# Patient Record
Sex: Male | Born: 1990
Health system: Southern US, Community
[De-identification: ages and names within clinical notes are randomized; demographics above are authoritative.]

## PROBLEM LIST (undated history)

## (undated) DIAGNOSIS — I10 Essential (primary) hypertension: Secondary | ICD-10-CM

## (undated) HISTORY — PX: ANKLE SURGERY: SHX546

---

## 2012-12-31 ENCOUNTER — Encounter (HOSPITAL_COMMUNITY): Payer: Self-pay | Admitting: *Deleted

## 2012-12-31 ENCOUNTER — Emergency Department (HOSPITAL_COMMUNITY)
Admission: EM | Admit: 2012-12-31 | Discharge: 2013-01-01 | Disposition: A | Discharge status: Home / Self Care | Payer: Self-pay | Attending: Emergency Medicine | Admitting: Emergency Medicine

## 2012-12-31 ENCOUNTER — Emergency Department (HOSPITAL_COMMUNITY): Payer: Self-pay

## 2012-12-31 DIAGNOSIS — S8990XA Unspecified injury of unspecified lower leg, initial encounter: Secondary | ICD-10-CM | POA: Insufficient documentation

## 2012-12-31 DIAGNOSIS — F17200 Nicotine dependence, unspecified, uncomplicated: Secondary | ICD-10-CM | POA: Insufficient documentation

## 2012-12-31 DIAGNOSIS — M25561 Pain in right knee: Secondary | ICD-10-CM

## 2012-12-31 DIAGNOSIS — Y929 Unspecified place or not applicable: Secondary | ICD-10-CM | POA: Insufficient documentation

## 2012-12-31 DIAGNOSIS — X500XXA Overexertion from strenuous movement or load, initial encounter: Secondary | ICD-10-CM | POA: Insufficient documentation

## 2012-12-31 DIAGNOSIS — Y9389 Activity, other specified: Secondary | ICD-10-CM | POA: Insufficient documentation

## 2012-12-31 HISTORY — DX: Essential (primary) hypertension: I10
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
## 2012-12-31 NOTE — ED Provider Notes (Signed)
History    This chart was scribed for non-physician practitioner Magnus Sinning, PA working with Benny Lennert, MD by Quintella Reichert, ED Scribe. This patient was seen in room WTR5/WTR5 and the patient's care was started at 11:10 PM.   CSN: [id number]  Arrival date & time 12/31/12  2251     Chief Complaint: Knee Injury   The history is provided by the patient. No language interpreter was used.     HPI Comments: Harold Bender is a 22 y.o. male who presents to the Emergency Department complaining of a right knee injury that he sustained 30 minutes ago.  Pt states that "I picked up my fat friend" and fell and felt his right knee pop.  He immediately developed constant, moderate-to-severe pain to the knee that is greatly exacerbated by bearing weight   He denies weakness, numbness or tingling to the area.  He states that he has noticed mild swelling of the knee.  He admits to h/o meniscus tear in the left knee as a child and he states that his present pain is more severe.  He has not taken anything for pain prior to arrival.      No past medical history on file.   No past surgical history on file.   No family history on file.   History  Substance Use Topics  . Smoking status: Not on file  . Smokeless tobacco: Not on file  . Alcohol Use: Not on file     Review of Systems  All other systems reviewed and are negative.      Allergies  Review of patient's allergies indicates not on file.  Home Medications  No current outpatient prescriptions on file.  BP 169/81  Pulse 128  Temp(Src) 98 F (36.7 C) (Oral)  Resp 18  SpO2 99%  Physical Exam  Nursing note and vitals reviewed. Constitutional: He appears well-developed and well-nourished. No distress.  HENT:  Head: Normocephalic and atraumatic.  Eyes: Conjunctivae are normal.  Neck: Normal range of motion. Neck supple.  Cardiovascular: Normal rate, regular rhythm and normal heart sounds.   No murmur  heard. Pulmonary/Chest: Effort normal and breath sounds normal. No respiratory distress. He has no wheezes. He has no rales.  Musculoskeletal:       Right knee: He exhibits normal range of motion, no swelling, no erythema, normal alignment, no LCL laxity, normal patellar mobility, no bony tenderness and no MCL laxity. No tenderness found.  No bony tenderness to right knee Negative anterior and posterior drawer of the right knee Slight pain with valgus stress of the knee  Neurological: He is alert.  Skin: Skin is warm and dry.  Psychiatric: He has a normal mood and affect. His behavior is normal.    ED Course  Procedures (including critical care time)  DIAGNOSTIC STUDIES: Oxygen Saturation is 99% on room air, normal by my interpretation.    COORDINATION OF CARE: 11:16 PM: Discussed treatment plan which includes imaging.  Pt expressed understanding and agreed to plan.    Labs Reviewed - No data to display  Dg Knee Complete 4 Views Right  01/01/2013   *RADIOLOGY REPORT*  Clinical Data: Medial right knee pain following an injury.  RIGHT KNEE - COMPLETE 4+ VIEW  Comparison: None.  Findings: Normal appearing bones and soft tissues without fracture or dislocation.  Normal appearing bones and soft tissues without fracture, dislocation or effusion.  IMPRESSION: Normal examination.   Original Report Authenticated By: Beckie Salts, M.D.  No diagnosis found.  MDM  Patient presenting with pain of the right knee after picking up his friend just prior to arrival.  He states that he felt a "popping" in his knee.  No obvious laxity of the ligaments of the knee at this time.  Xray negative.  Patient neurovascularly intact.  Patient given knee immobilizer and crutches and also referral to Orthopedics.   I personally performed the services described in this documentation, which was scribed in my presence. The recorded information has been reviewed and is accurate.    Pascal Lux [place],  PA-C 01/01/13 1515

## 2013-01-01 MED ORDER — NAPROXEN 500 MG PO TABS: 500.0000 mg | ORAL_TABLET | Freq: Two times a day (BID) | ORAL | Status: DC

## 2013-01-01 MED ORDER — HYDROCODONE-ACETAMINOPHEN 5-325 MG PO TABS: 1.0000 | ORAL_TABLET | Freq: Four times a day (QID) | ORAL | Status: DC | PRN

## 2013-01-02 NOTE — ED Provider Notes (Signed)
Medical screening examination/treatment/procedure(s) were performed by non-physician practitioner and as supervising physician I was immediately available for consultation/collaboration.   Benny Lennert, MD 01/02/13 1539

## 2015-11-04 ENCOUNTER — Emergency Department (HOSPITAL_COMMUNITY)
Admission: EM | Admit: 2015-11-04 | Discharge: 2015-11-04 | Disposition: A | Discharge status: Home / Self Care | Payer: No Typology Code available for payment source | Attending: Emergency Medicine | Admitting: Emergency Medicine

## 2015-11-04 ENCOUNTER — Encounter (HOSPITAL_COMMUNITY): Payer: Self-pay | Admitting: Emergency Medicine

## 2015-11-04 DIAGNOSIS — M549 Dorsalgia, unspecified: Secondary | ICD-10-CM | POA: Diagnosis present

## 2015-11-04 DIAGNOSIS — Y92410 Unspecified street and highway as the place of occurrence of the external cause: Secondary | ICD-10-CM | POA: Diagnosis not present

## 2015-11-04 DIAGNOSIS — Z791 Long term (current) use of non-steroidal anti-inflammatories (NSAID): Secondary | ICD-10-CM | POA: Insufficient documentation

## 2015-11-04 DIAGNOSIS — I10 Essential (primary) hypertension: Secondary | ICD-10-CM | POA: Insufficient documentation

## 2015-11-04 DIAGNOSIS — Z79891 Long term (current) use of opiate analgesic: Secondary | ICD-10-CM | POA: Diagnosis not present

## 2015-11-04 DIAGNOSIS — Y999 Unspecified external cause status: Secondary | ICD-10-CM | POA: Insufficient documentation

## 2015-11-04 DIAGNOSIS — M62830 Muscle spasm of back: Secondary | ICD-10-CM | POA: Diagnosis not present

## 2015-11-04 DIAGNOSIS — Z79899 Other long term (current) drug therapy: Secondary | ICD-10-CM | POA: Insufficient documentation

## 2015-11-04 DIAGNOSIS — Y939 Activity, unspecified: Secondary | ICD-10-CM | POA: Diagnosis not present

## 2015-11-04 DIAGNOSIS — F17210 Nicotine dependence, cigarettes, uncomplicated: Secondary | ICD-10-CM | POA: Diagnosis not present

## 2015-11-04 MED ORDER — CYCLOBENZAPRINE HCL 5 MG PO TABS: 5.0000 mg | ORAL_TABLET | Freq: Three times a day (TID) | ORAL | Status: DC | PRN

## 2015-11-04 MED ORDER — CYCLOBENZAPRINE HCL 10 MG PO TABS: 5.0000 mg | ORAL_TABLET | Freq: Once | ORAL | Status: DC

## 2015-11-04 MED ORDER — IBUPROFEN 800 MG PO TABS: 800.0000 mg | ORAL_TABLET | Freq: Three times a day (TID) | ORAL | Status: DC

## 2015-11-04 MED ORDER — IBUPROFEN 800 MG PO TABS
800.0000 mg | ORAL_TABLET | Freq: Once | ORAL | Status: AC
  Administered 2015-11-04: 800 mg via ORAL
  Filled 2015-11-04: qty 1
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
## 2015-11-04 NOTE — Discharge Instructions (Signed)
Take motrin for pain.   Take flexeril for muscle spasms.   Follow up with your doctor.   Recheck blood pressure with your doctor in a week.   Return to ER if you have severe back pain, unable to walk, numbness, weakness, abdominal pain, chest pain
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
## 2016-01-28 ENCOUNTER — Encounter (HOSPITAL_COMMUNITY): Payer: Self-pay | Admitting: *Deleted

## 2016-01-28 ENCOUNTER — Ambulatory Visit (INDEPENDENT_AMBULATORY_CARE_PROVIDER_SITE_OTHER): Payer: BLUE CROSS/BLUE SHIELD

## 2016-01-28 ENCOUNTER — Ambulatory Visit (HOSPITAL_COMMUNITY)
Admission: EM | Admit: 2016-01-28 | Discharge: 2016-01-28 | Disposition: A | Discharge status: Home / Self Care | Payer: BLUE CROSS/BLUE SHIELD | Attending: Family Medicine | Admitting: Family Medicine

## 2016-01-28 DIAGNOSIS — I10 Essential (primary) hypertension: Secondary | ICD-10-CM

## 2016-01-28 DIAGNOSIS — R0789 Other chest pain: Secondary | ICD-10-CM

## 2016-01-28 MED ORDER — DICLOFENAC POTASSIUM 50 MG PO TABS: 50.0000 mg | ORAL_TABLET | Freq: Three times a day (TID) | ORAL | 1 refills | Status: DC

## 2016-01-28 MED ORDER — KETOROLAC TROMETHAMINE 30 MG/ML IJ SOLN
30.0000 mg | Freq: Once | INTRAMUSCULAR | Status: AC
  Administered 2016-01-28: 30 mg via INTRAMUSCULAR

## 2016-01-28 MED ORDER — KETOROLAC TROMETHAMINE 30 MG/ML IJ SOLN
INTRAMUSCULAR | Status: AC
  Filled 2016-01-28: qty 1

## 2016-01-28 NOTE — ED Provider Notes (Signed)
[facility]    CSN: [id number] Arrival date & time: 01/28/16  1650  First Provider Contact:  First MD Initiated Contact with Patient 01/28/16 1753        History   Chief Complaint Chief Complaint  Patient presents with  . Chest Pain    HPI Harold Bender is a 25 y.o. male.   The history is provided by the patient.  Chest Pain  Pain quality: sharp   Pain radiates to:  Does not radiate Pain severity:  Mild Duration:  1 week Progression:  Unchanged Chronicity:  New Context: movement   Relieved by:  None tried Worsened by:  Nothing Ineffective treatments:  None tried Associated symptoms: no abdominal pain, no back pain, no fever, no palpitations and no shortness of breath   Risk factors: male sex     Past Medical History:  Diagnosis Date  . Hypertension     There are no active problems to display for this patient.   History reviewed. No pertinent surgical history.     Home Medications    Prior to Admission medications   Medication Sig Start Date End Date Taking? Authorizing Provider  cetirizine (ZYRTEC) 5 MG tablet Take 5 mg by mouth daily.    Historical Provider, MD  cyclobenzaprine (FLEXERIL) 5 MG tablet Take 1 tablet (5 mg total) by mouth 3 (three) times daily as needed for muscle spasms. 11/04/15   Charlynne Pander, MD  fexofenadine-pseudoephedrine (ALLEGRA-D 24) 180-240 MG per 24 hr tablet Take 1 tablet by mouth daily.    Historical Provider, MD  HYDROcodone-acetaminophen (NORCO/VICODIN) 5-325 MG per tablet Take 1-2 tablets by mouth every 6 (six) hours as needed for pain. 01/01/13   Heather Laisure, PA-C  ibuprofen (ADVIL,MOTRIN) 800 MG tablet Take 1 tablet (800 mg total) by mouth 3 (three) times daily. 11/04/15   Charlynne Pander, MD  naproxen (NAPROSYN) 500 MG tablet Take 1 tablet (500 mg total) by mouth 2 (two) times daily. 01/01/13   Santiago Glad, PA-C    Family History History reviewed. No pertinent family history.  Social  History Social History  Substance Use Topics  . Smoking status: Current Every Day Smoker    Types: Cigarettes  . Smokeless tobacco: Not on file  . Alcohol use Yes     Allergies   Apple   Review of Systems Review of Systems  Constitutional: Negative.  Negative for fever.  HENT: Negative.   Respiratory: Negative for shortness of breath.   Cardiovascular: Positive for chest pain. Negative for palpitations.  Gastrointestinal: Negative for abdominal pain.  Musculoskeletal: Negative for back pain.  All other systems reviewed and are negative.    Physical Exam Triage Vital Signs ED Triage Vitals  Enc Vitals Group     BP 01/28/16 1725 (!) 169/114     Pulse Rate 01/28/16 1725 78     Resp 01/28/16 1725 12     Temp 01/28/16 1725 99.6 F (37.6 C)     Temp Source 01/28/16 1725 Oral     SpO2 01/28/16 1725 100 %     Weight --      Height --      Head Circumference --      Peak Flow --      Pain Score 01/28/16 1734 6     Pain Loc --      Pain Edu? --      Excl. in GC? --    No data found.   Updated Vital Signs BP Marland Kitchen)  190/140 (BP Location: Right Arm)   Pulse 78   Temp 99.6 F (37.6 C) (Oral)   Resp 12   SpO2 100%   Visual Acuity Right Eye Distance:   Left Eye Distance:   Bilateral Distance:    Right Eye Near:   Left Eye Near:    Bilateral Near:     Physical Exam  Constitutional: He is oriented to person, place, and time. He appears well-developed and well-nourished. No distress.  HENT:  Head: Normocephalic.  Right Ear: External ear normal.  Left Ear: External ear normal.  Mouth/Throat: Oropharynx is clear and moist.  Eyes: Pupils are equal, round, and reactive to light.  Neck: Normal range of motion. Neck supple.  Cardiovascular: Normal rate, regular rhythm, normal heart sounds and intact distal pulses.   Pulmonary/Chest: Effort normal and breath sounds normal. He exhibits tenderness.  Abdominal: Soft. Bowel sounds are normal.  Musculoskeletal: Normal  range of motion.  Lymphadenopathy:    He has no cervical adenopathy.  Neurological: He is alert and oriented to person, place, and time.  Skin: Skin is warm and dry.  Nursing note and vitals reviewed.    UC Treatments / Results  Labs (all labs ordered are listed, but only abnormal results are displayed) Labs Reviewed - No data to display  EKG ED ECG REPORT   Date: 01/28/2016  Rate: 75  Rhythm: normal sinus rhythm  QRS Axis: normal  Intervals: normal  ST/T Wave abnormalities: normal  Conduction Disutrbances:none  Narrative Interpretation: wnl.  Old EKG Reviewed: none available  I have personally reviewed the EKG tracing and agree with the computerized printout as noted.     Radiology No results found. X-rays reviewed and report per radiologist. Procedures Procedures (including critical care time) X-rays reviewed and report per radiologist.  Medications Ordered in UC Medications - No data to display   Initial Impression / Assessment and Plan / UC Course  I have reviewed the triage vital signs and the nursing notes.  Pertinent labs & imaging results that were available during my care of the patient were reviewed by me and considered in my medical decision making (see chart for details).  Clinical Course      Final Clinical Impressions(s) / UC Diagnoses   Final diagnoses:  None    New Prescriptions New Prescriptions   No medications on file     Linna Hoff, MD 01/28/16 1858

## 2016-01-28 NOTE — ED Triage Notes (Signed)
Pt  Reports  Chest  Discomfort  For    About  1   Week      He  Has  High  Blood  Pressure  In past  But  Has  Not  Been on  Any  meds  For  Same

## 2019-03-11 ENCOUNTER — Other Ambulatory Visit: Payer: Self-pay

## 2019-03-11 DIAGNOSIS — Z20822 Contact with and (suspected) exposure to covid-19: Secondary | ICD-10-CM

## 2019-03-12 LAB — NOVEL CORONAVIRUS, NAA: SARS-CoV-2, NAA: NOT DETECTED

## 2021-05-06 ENCOUNTER — Emergency Department (HOSPITAL_COMMUNITY): Payer: BC Managed Care – PPO

## 2021-05-06 ENCOUNTER — Inpatient Hospital Stay (HOSPITAL_COMMUNITY)
Admission: EM | Admit: 2021-05-06 | Discharge: 2021-05-10 | DRG: 193 | Disposition: A | Discharge status: Home / Self Care | Payer: BC Managed Care – PPO | Source: Ambulatory Visit | Attending: Internal Medicine | Admitting: Internal Medicine

## 2021-05-06 ENCOUNTER — Encounter (HOSPITAL_COMMUNITY): Payer: Self-pay | Admitting: Internal Medicine

## 2021-05-06 DIAGNOSIS — I517 Cardiomegaly: Secondary | ICD-10-CM | POA: Diagnosis not present

## 2021-05-06 DIAGNOSIS — I110 Hypertensive heart disease with heart failure: Secondary | ICD-10-CM | POA: Diagnosis not present

## 2021-05-06 DIAGNOSIS — R17 Unspecified jaundice: Secondary | ICD-10-CM | POA: Diagnosis present

## 2021-05-06 DIAGNOSIS — F1210 Cannabis abuse, uncomplicated: Secondary | ICD-10-CM | POA: Diagnosis present

## 2021-05-06 DIAGNOSIS — Z79899 Other long term (current) drug therapy: Secondary | ICD-10-CM | POA: Diagnosis not present

## 2021-05-06 DIAGNOSIS — J209 Acute bronchitis, unspecified: Secondary | ICD-10-CM | POA: Diagnosis not present

## 2021-05-06 DIAGNOSIS — R059 Cough, unspecified: Secondary | ICD-10-CM | POA: Diagnosis not present

## 2021-05-06 DIAGNOSIS — F1910 Other psychoactive substance abuse, uncomplicated: Secondary | ICD-10-CM | POA: Diagnosis present

## 2021-05-06 DIAGNOSIS — Z20822 Contact with and (suspected) exposure to covid-19: Secondary | ICD-10-CM | POA: Diagnosis not present

## 2021-05-06 DIAGNOSIS — T465X6A Underdosing of other antihypertensive drugs, initial encounter: Secondary | ICD-10-CM | POA: Diagnosis present

## 2021-05-06 DIAGNOSIS — F17210 Nicotine dependence, cigarettes, uncomplicated: Secondary | ICD-10-CM | POA: Diagnosis present

## 2021-05-06 DIAGNOSIS — Z6836 Body mass index (BMI) 36.0-36.9, adult: Secondary | ICD-10-CM

## 2021-05-06 DIAGNOSIS — G4733 Obstructive sleep apnea (adult) (pediatric): Secondary | ICD-10-CM | POA: Diagnosis not present

## 2021-05-06 DIAGNOSIS — Z91120 Patient's intentional underdosing of medication regimen due to financial hardship: Secondary | ICD-10-CM | POA: Diagnosis not present

## 2021-05-06 DIAGNOSIS — D696 Thrombocytopenia, unspecified: Secondary | ICD-10-CM | POA: Diagnosis not present

## 2021-05-06 DIAGNOSIS — I43 Cardiomyopathy in diseases classified elsewhere: Secondary | ICD-10-CM | POA: Diagnosis present

## 2021-05-06 DIAGNOSIS — Z8249 Family history of ischemic heart disease and other diseases of the circulatory system: Secondary | ICD-10-CM

## 2021-05-06 DIAGNOSIS — F1410 Cocaine abuse, uncomplicated: Secondary | ICD-10-CM | POA: Diagnosis present

## 2021-05-06 DIAGNOSIS — R0789 Other chest pain: Secondary | ICD-10-CM | POA: Diagnosis not present

## 2021-05-06 DIAGNOSIS — R7989 Other specified abnormal findings of blood chemistry: Secondary | ICD-10-CM | POA: Diagnosis not present

## 2021-05-06 DIAGNOSIS — I420 Dilated cardiomyopathy: Secondary | ICD-10-CM | POA: Diagnosis not present

## 2021-05-06 DIAGNOSIS — J189 Pneumonia, unspecified organism: Secondary | ICD-10-CM | POA: Diagnosis not present

## 2021-05-06 DIAGNOSIS — E876 Hypokalemia: Secondary | ICD-10-CM | POA: Diagnosis not present

## 2021-05-06 DIAGNOSIS — I161 Hypertensive emergency: Secondary | ICD-10-CM | POA: Diagnosis present

## 2021-05-06 DIAGNOSIS — R000 Tachycardia, unspecified: Secondary | ICD-10-CM | POA: Diagnosis not present

## 2021-05-06 DIAGNOSIS — R9431 Abnormal electrocardiogram [ECG] [EKG]: Secondary | ICD-10-CM | POA: Diagnosis not present

## 2021-05-06 DIAGNOSIS — I5021 Acute systolic (congestive) heart failure: Secondary | ICD-10-CM | POA: Diagnosis not present

## 2021-05-06 DIAGNOSIS — E669 Obesity, unspecified: Secondary | ICD-10-CM | POA: Diagnosis not present

## 2021-05-06 DIAGNOSIS — I5041 Acute combined systolic (congestive) and diastolic (congestive) heart failure: Secondary | ICD-10-CM | POA: Diagnosis present

## 2021-05-06 DIAGNOSIS — Z6839 Body mass index (BMI) 39.0-39.9, adult: Secondary | ICD-10-CM | POA: Diagnosis not present

## 2021-05-06 DIAGNOSIS — R0602 Shortness of breath: Secondary | ICD-10-CM | POA: Diagnosis not present

## 2021-05-06 DIAGNOSIS — I10 Essential (primary) hypertension: Secondary | ICD-10-CM | POA: Diagnosis present

## 2021-05-06 DIAGNOSIS — R079 Chest pain, unspecified: Secondary | ICD-10-CM | POA: Diagnosis not present

## 2021-05-06 LAB — CBC WITH DIFFERENTIAL/PLATELET
Abs Immature Granulocytes: 0.01 10*3/uL (ref 0.00–0.07)
Basophils Absolute: 0 10*3/uL (ref 0.0–0.1)
Basophils Relative: 1 %
Eosinophils Absolute: 0 10*3/uL (ref 0.0–0.5)
Eosinophils Relative: 1 %
HCT: 41.9 % (ref 39.0–52.0)
Hemoglobin: 14.4 g/dL (ref 13.0–17.0)
Immature Granulocytes: 0 %
Lymphocytes Relative: 11 %
Lymphs Abs: 0.7 10*3/uL (ref 0.7–4.0)
MCH: 29.5 pg (ref 26.0–34.0)
MCHC: 34.4 g/dL (ref 30.0–36.0)
MCV: 85.9 fL (ref 80.0–100.0)
Monocytes Absolute: 0.3 10*3/uL (ref 0.1–1.0)
Monocytes Relative: 4 %
Neutro Abs: 5.3 10*3/uL (ref 1.7–7.7)
Neutrophils Relative %: 83 %
Platelets: 156 10*3/uL (ref 150–400)
RBC: 4.88 MIL/uL (ref 4.22–5.81)
RDW: 14.3 % (ref 11.5–15.5)
WBC: 6.4 10*3/uL (ref 4.0–10.5)
nRBC: 0 % (ref 0.0–0.2)

## 2021-05-06 LAB — BASIC METABOLIC PANEL
Anion gap: 9 (ref 5–15)
BUN: 12 mg/dL (ref 6–20)
CO2: 21 mmol/L — ABNORMAL LOW (ref 22–32)
Calcium: 8.7 mg/dL — ABNORMAL LOW (ref 8.9–10.3)
Chloride: 107 mmol/L (ref 98–111)
Creatinine, Ser: 1.19 mg/dL (ref 0.61–1.24)
GFR, Estimated: >60 mL/min (ref >60)
Glucose, Bld: 127 mg/dL — ABNORMAL HIGH (ref 70–99)
Potassium: 3.4 mmol/L — ABNORMAL LOW (ref 3.5–5.1)
Sodium: 137 mmol/L (ref 135–145)

## 2021-05-06 LAB — RESP PANEL BY RT-PCR (FLU A&B, COVID) ARPGX2
Influenza A by PCR: NEGATIVE
Influenza B by PCR: NEGATIVE
SARS Coronavirus 2 by RT PCR: NEGATIVE

## 2021-05-06 LAB — BRAIN NATRIURETIC PEPTIDE: B Natriuretic Peptide: 1019 pg/mL — ABNORMAL HIGH (ref 0.0–100.0)

## 2021-05-06 LAB — PROCALCITONIN: Procalcitonin: 0.14 ng/mL

## 2021-05-06 LAB — LACTIC ACID, PLASMA: Lactic Acid, Venous: 1.2 mmol/L (ref 0.5–1.9)

## 2021-05-06 LAB — MRSA NEXT GEN BY PCR, NASAL: MRSA by PCR Next Gen: NOT DETECTED

## 2021-05-06 MED ORDER — ONDANSETRON HCL 4 MG PO TABS: 4.0000 mg | ORAL_TABLET | Freq: Four times a day (QID) | ORAL | Status: DC | PRN

## 2021-05-06 MED ORDER — SODIUM CHLORIDE 0.9 % IV SOLN
500.0000 mg | INTRAVENOUS | Status: DC
  Administered 2021-05-06: 500 mg via INTRAVENOUS
  Filled 2021-05-06: qty 500

## 2021-05-06 MED ORDER — SODIUM CHLORIDE 0.9% FLUSH
3.0000 mL | Freq: Two times a day (BID) | INTRAVENOUS | Status: DC
  Administered 2021-05-06 – 2021-05-10 (×7): 3 mL via INTRAVENOUS

## 2021-05-06 MED ORDER — ACETAMINOPHEN 325 MG PO TABS
650.0000 mg | ORAL_TABLET | Freq: Four times a day (QID) | ORAL | Status: DC | PRN
  Administered 2021-05-07 – 2021-05-09 (×3): 650 mg via ORAL
  Filled 2021-05-06 (×4): qty 2

## 2021-05-06 MED ORDER — OXYCODONE HCL 5 MG PO TABS
5.0000 mg | ORAL_TABLET | ORAL | Status: DC | PRN
  Administered 2021-05-06 – 2021-05-08 (×3): 5 mg via ORAL
  Filled 2021-05-06 (×3): qty 1

## 2021-05-06 MED ORDER — POLYETHYLENE GLYCOL 3350 17 G PO PACK: 17.0000 g | PACK | Freq: Every day | ORAL | Status: DC | PRN

## 2021-05-06 MED ORDER — DILTIAZEM HCL 25 MG/5ML IV SOLN
20.0000 mg | Freq: Once | INTRAVENOUS | Status: AC
  Administered 2021-05-06: 20 mg via INTRAVENOUS
  Filled 2021-05-06: qty 5

## 2021-05-06 MED ORDER — ACETAMINOPHEN 650 MG RE SUPP: 650.0000 mg | Freq: Four times a day (QID) | RECTAL | Status: DC | PRN

## 2021-05-06 MED ORDER — HYDRALAZINE HCL 20 MG/ML IJ SOLN
10.0000 mg | INTRAMUSCULAR | Status: DC | PRN
  Administered 2021-05-06 – 2021-05-08 (×3): 10 mg via INTRAVENOUS
  Filled 2021-05-06 (×4): qty 1

## 2021-05-06 MED ORDER — CHLORHEXIDINE GLUCONATE CLOTH 2 % EX PADS
6.0000 | MEDICATED_PAD | Freq: Every day | CUTANEOUS | Status: DC
  Administered 2021-05-06 – 2021-05-10 (×4): 6 via TOPICAL

## 2021-05-06 MED ORDER — BISACODYL 5 MG PO TBEC: 5.0000 mg | DELAYED_RELEASE_TABLET | Freq: Every day | ORAL | Status: DC | PRN

## 2021-05-06 MED ORDER — HYDRALAZINE HCL 20 MG/ML IJ SOLN
5.0000 mg | INTRAMUSCULAR | Status: DC | PRN
  Administered 2021-05-06: 5 mg via INTRAVENOUS
  Filled 2021-05-06: qty 1

## 2021-05-06 MED ORDER — ENOXAPARIN SODIUM 40 MG/0.4ML IJ SOSY
40.0000 mg | PREFILLED_SYRINGE | INTRAMUSCULAR | Status: DC
  Administered 2021-05-06: 40 mg via SUBCUTANEOUS
  Filled 2021-05-06: qty 0.4

## 2021-05-06 MED ORDER — NICOTINE 7 MG/24HR TD PT24
7.0000 mg | MEDICATED_PATCH | Freq: Every day | TRANSDERMAL | Status: DC
Start: 2021-05-06 — End: 2021-05-09
  Filled 2021-05-06 (×4): qty 1

## 2021-05-06 MED ORDER — DOCUSATE SODIUM 100 MG PO CAPS
100.0000 mg | ORAL_CAPSULE | Freq: Two times a day (BID) | ORAL | Status: DC
  Administered 2021-05-06 – 2021-05-08 (×3): 100 mg via ORAL
  Filled 2021-05-06 (×8): qty 1

## 2021-05-06 MED ORDER — LACTATED RINGERS IV SOLN: INTRAVENOUS | Status: DC

## 2021-05-06 MED ORDER — CLONIDINE HCL 0.1 MG PO TABS
0.1000 mg | ORAL_TABLET | Freq: Once | ORAL | Status: AC
  Administered 2021-05-06: 0.1 mg via ORAL
  Filled 2021-05-06: qty 1

## 2021-05-06 MED ORDER — NICARDIPINE HCL IN NACL 20-0.86 MG/200ML-% IV SOLN
3.0000 mg/h | INTRAVENOUS | Status: DC
  Filled 2021-05-06: qty 200

## 2021-05-06 MED ORDER — ONDANSETRON HCL 4 MG/2ML IJ SOLN: 4.0000 mg | Freq: Four times a day (QID) | INTRAMUSCULAR | Status: DC | PRN

## 2021-05-06 MED ORDER — ALBUTEROL SULFATE HFA 108 (90 BASE) MCG/ACT IN AERS
2.0000 | INHALATION_SPRAY | RESPIRATORY_TRACT | Status: DC | PRN
  Administered 2021-05-06: 2 via RESPIRATORY_TRACT
  Filled 2021-05-06: qty 6.7

## 2021-05-06 MED ORDER — SODIUM CHLORIDE 0.9 % IV SOLN
INTRAVENOUS | Status: DC
Start: 2021-05-06 — End: 2021-05-07

## 2021-05-06 MED ORDER — MORPHINE SULFATE (PF) 2 MG/ML IV SOLN: 2.0000 mg | INTRAVENOUS | Status: DC | PRN

## 2021-05-06 MED ORDER — SODIUM CHLORIDE 0.9 % IV SOLN
1.0000 g | INTRAVENOUS | Status: DC
  Administered 2021-05-06 – 2021-05-09 (×4): 1 g via INTRAVENOUS
  Filled 2021-05-06 (×5): qty 10

## 2021-05-06 MED ORDER — LOSARTAN POTASSIUM 50 MG PO TABS: 25.0000 mg | ORAL_TABLET | Freq: Every day | ORAL | Status: DC

## 2021-05-06 MED ORDER — GUAIFENESIN ER 600 MG PO TB12: 600.0000 mg | ORAL_TABLET | Freq: Two times a day (BID) | ORAL | Status: DC | PRN

## 2021-05-06 NOTE — Assessment & Plan Note (Signed)
-  Likely associated with infection, anticipate resolution once treated -Will attempt to avoid QT-prolonging medications such as PPI, nausea meds, SSRIs -Repeat EKG in AM

## 2021-05-06 NOTE — H&P (Signed)
History and Physical    Harold Bender ZOX:[id number] DOB: 11/15/1990 DOA: 05/06/2021  PCP: Patient, No Pcp Per (Inactive) - last years ago until this AM (saw Harold Bender at [facility] this AM) Consultants:  None Patient coming from:  Home - lives with wife and a family friend; NOK: Wife, [phone]  Chief Complaint: Cough  HPI: Harold Bender is a 30 y.o. male with medical history significant of HTN (off meds for 2 years) presenting with cough. He reports cough that has been recurring.  The last 2 days it was unbearable with chest and abdominal pain.  Also has not taken BP meds in 2-3 years, unable to afford.  He had a mild fever Monday briefly.  Wife started coughing last night.  Chest pain was with coughing and would radiate into his ribs, otherwise not present.  No headache.   ED Course: Cough, SOB.  Has PNA on CXR.  Profoundly HTN, uses cocaine.  Started on IV abx.  Given one time dose of Cardizem, given Clonidine (ordered, not given yet) - if no response, may need Cardene drip.  Review of Systems: As per HPI; otherwise review of systems reviewed and negative.   Ambulatory Status:  Ambulates without assistance  COVID Vaccine Status:   Complete  Past Medical History:  Diagnosis Date   Hypertension     Past Surgical History:  Procedure Laterality Date   ANKLE SURGERY Left     Social History   Socioeconomic History   Marital status: Single    Spouse name: Not on file   Number of children: Not on file   Years of education: Not on file   Highest education level: Not on file  Occupational History   Occupation: insurance  Tobacco Use   Smoking status: Some Days    Types: Cigarettes   Smokeless tobacco: Never   Tobacco comments:    With alcohol  Substance and Sexual Activity   Alcohol use: Yes    Comment: binge drinking - 7-10 drinks on weekned nights   Drug use: Yes    Types: Marijuana, Cocaine    Comment: daily marijuana use; last used cocaine 2 weeks ago   Sexual  activity: Yes    Birth control/protection: None  Other Topics Concern   Not on file  Social History Narrative   Not on file   Social Determinants of Health   Financial Resource Strain: Not on file  Food Insecurity: Not on file  Transportation Needs: Not on file  Physical Activity: Not on file  Stress: Not on file  Social Connections: Not on file  Intimate Partner Violence: Not on file    Allergies  Allergen Reactions   Apple Nausea And Vomiting    History reviewed. No pertinent family history.  Prior to Admission medications   Medication Sig Start Date End Date Taking? Authorizing Provider  cetirizine (ZYRTEC) 5 MG tablet Take 5 mg by mouth daily.    [provider]  cyclobenzaprine (FLEXERIL) 5 MG tablet Take 1 tablet (5 mg total) by mouth 3 (three) times daily as needed for muscle spasms. 11/04/15   Charlynne Pander, MD  diclofenac (CATAFLAM) 50 MG tablet Take 1 tablet (50 mg total) by mouth 3 (three) times daily. Prn chest pain 01/28/16   Linna Hoff, MD  fexofenadine-pseudoephedrine (ALLEGRA-D 24) 180-240 MG per 24 hr tablet Take 1 tablet by mouth daily.    [provider]  HYDROcodone-acetaminophen (NORCO/VICODIN) 5-325 MG per tablet Take 1-2 tablets by mouth every 6 (  six) hours as needed for pain. 01/01/13   Santiago Glad, PA-C  ibuprofen (ADVIL,MOTRIN) 800 MG tablet Take 1 tablet (800 mg total) by mouth 3 (three) times daily. 11/04/15   Charlynne Pander, MD  naproxen (NAPROSYN) 500 MG tablet Take 1 tablet (500 mg total) by mouth 2 (two) times daily. 01/01/13   Santiago Glad, PA-C    Physical Exam: Vitals:   05/06/21 1415 05/06/21 1430 05/06/21 1558 05/06/21 1600  BP: (!) 221/147 (!) 214/148 (!) 197/133 (!) 197/133  Pulse: (!) 120 (!) 118 (!) 119 (!) 117  Resp: 18 (!) 22 (!) 22 (!) 21  Temp:   98.6 F (37 C)   TempSrc:   Oral   SpO2: 96% 95% 95% 96%     General:  Appears calm and comfortable and is in NAD Eyes:  PERRL, EOMI, normal  lids, iris ENT:  grossly normal hearing, lips & tongue, mildly dry mm Neck:  no LAD, masses or thyromegaly Cardiovascular:  RR with tachycardia to mid-120s, no m/r/g. No LE edema.  Respiratory:   Scattered rhonchi, mostly in LLL.  Normal to mildly increased respiratory effort. Abdomen:  soft, NT, ND Back:   normal alignment, no CVAT Skin:  no rash or induration seen on limited exam Musculoskeletal:  grossly normal tone BUE/BLE, good ROM, no bony abnormality Psychiatric:  grossly normal mood and affect, speech fluent and appropriate, AOx3 Neurologic:  CN 2-12 grossly intact, moves all extremities in coordinated fashion    Radiological Exams on Admission: Independently reviewed - see discussion in A/P where applicable  DG Chest 2 View  Result Date: 05/06/2021 CLINICAL DATA:  Shortness of breath, cough and chest tightness EXAM: CHEST - 2 VIEW COMPARISON:  01/28/2016 FINDINGS: Cardiomegaly. Mediastinal shadows are normal. There is central bronchial thickening. There is patchy perihilar density right more than left consistent with mild pneumonia. No lobar consolidation or collapse. No effusion. Bony structures unremarkable. IMPRESSION: Bronchitis.  Mild patchy perihilar pneumonia. Electronically Signed   By: Paulina Fusi M.D.   On: 05/06/2021 12:10    EKG: Independently reviewed. Sinus tachycardia with rate 130; prolonged QTc 618; nonspecific ST changes    Labs on Admission: I have personally reviewed the available labs and imaging studies at the time of the admission.  Pertinent labs:   Glucose 127 BNP 1019.0 Lactate 1.2 Normal CBC COVID/flu negative   Assessment/Plan Principal Problem:   Multifocal pneumonia Active Problems:   Uncontrolled hypertension   Elevated brain natriuretic peptide (BNP) level   Prolonged QT interval   Polysubstance abuse (HCC)    * Multifocal pneumonia -Patient presenting with cough, subjective fever, tachypnea, and patchy perihilar infiltrates on  chest x-ray -This appears to be most likely community-acquired pneumonia, possibly viral.  -Influenza negative. -COVID-19 negative. -Will order gram stain/culture, GAS, and lower respiratory tract procalcitonin level. -CURB-65 score is 1-2 - will admit for further evaluation/treatment. -Pneumonia Severity Index (PSI) is Class 2, 0.6% mortality. -Will start Azithromycin 500 mg IV daily and Rocephin due to no risk factors for MDR cause -Fever control -Repeat CBC in am -Will add albuterol PRN -Will add Mucinex for cough  Polysubstance abuse (HCC) -Daily marijuana use with heavy weekend ETOH/tobacco/cocaine use -Cessation encouraged; this should be encouraged on an ongoing basis -UDS ordered -Tobacco Dependence: encourage cessation; this was discussed with the patient and should be reviewed on an ongoing basis.   -Patch ordered at patient request.  Prolonged QT interval -Likely associated with infection, anticipate resolution once treated -Will attempt to avoid  QT-prolonging medications such as PPI, nausea meds, SSRIs -Repeat EKG in AM  Elevated brain natriuretic peptide (BNP) level -No prior baseline available -No obvious edema or volume overload -Given cocaine use, prolonged QT, and uncontrolled HTN, will order Echo for further evaluation  Uncontrolled hypertension -Patient with known h/o HTN but has not taken medications for several years -He now has insurance and is planning to start taking -He was given Cozaar at UC this AM and took his first dose; will continue -He was given Cardizem and Clonidine in the ER without significant improvement -Appears to be stable and without complaint for now so will hold Cardene drip -Will add prn hydralazine -Consider addition of beta blocker or other medication as he appears likely to need multiple medications for good control      Note: This patient has been tested and is negative for the novel coronavirus COVID-19. He has been fully  vaccinated against COVID-19.    Level of care: Stepdown  DVT prophylaxis: Lovenox  Code Status:  Full - confirmed with patient/family Family Communication: Wife was present throughout evaluation Disposition Plan:  The patient is from: home  Anticipated d/c is to: home without [facility] services   Anticipated d/c date will depend on clinical response to treatment, likely 2-3 days  Patient is currently: acutely ill Consults called: TOC team Admission status: Admit - It is my clinical opinion that admission to INPATIENT is reasonable and necessary because of the expectation that this patient will require hospital care that crosses at least 2 midnights to treat this condition based on the medical complexity of the problems presented.  Given the aforementioned information, the predictability of an adverse outcome is felt to be significant.    Jonah Blue MD [facility]   How to contact the [facility] Attending or Consulting provider 7A - 7P or covering provider during after hours 7P -7A, for this patient?  Check the care team in [facility] and look for a) attending/consulting TR[facility] provider listed and b) the [facility] team listed Log into [email or web address] and use 's universal password to access. If you do not have the password, please contact the hospital operator. Locate the [facility] provider you are looking for under [facility] and page to a number that you can be directly reached. If you still have difficulty reaching the provider, please page the [facility] (Director on Call) for the Hospitalists listed on amion for assistance.   05/06/2021, 4:45 PM

## 2021-05-06 NOTE — ED Notes (Signed)
Dr.Yates stated to hold Cardene for the moment

## 2021-05-06 NOTE — Assessment & Plan Note (Signed)
-  Patient presenting with cough, subjective fever, tachypnea, and patchy perihilar infiltrates on chest x-ray -This appears to be most likely community-acquired pneumonia, possibly viral.  -Influenza negative. -COVID-19 negative. -Will order gram stain/culture, GAS, and lower respiratory tract procalcitonin level. -CURB-65 score is 1-2 - will admit for further evaluation/treatment. -Pneumonia Severity Index (PSI) is Class 2, 0.6% mortality. -Will start Azithromycin 500 mg IV daily and Rocephin due to no risk factors for MDR cause -Fever control -Repeat CBC in am -Will add albuterol PRN -Will add Mucinex for cough

## 2021-05-06 NOTE — ED Notes (Signed)
RN unable to take report at this time 1232

## 2021-05-06 NOTE — Assessment & Plan Note (Signed)
-  Patient with known h/o HTN but has not taken medications for several years -He now has insurance and is planning to start taking -He was given Cozaar at UC this AM and took his first dose; will continue -He was given Cardizem and Clonidine in the ER without significant improvement -Appears to be stable and without complaint for now so will hold Cardene drip -Will add prn hydralazine -Consider addition of beta blocker or other medication as he appears likely to need multiple medications for good control

## 2021-05-06 NOTE — ED Provider Notes (Signed)
Emergency Medicine Provider Triage Evaluation Note  Lister Brizzi , a 30 y.o. male  was evaluated in triage.  Pt complains of cough ongoing since March.  It comes and goes however it came back this morning and he decided to come in.  Blood pressure elevated, recently represcribed blood pressure medications.  Review of Systems  Positive: Cough, sometimes productive Negative: Fever, chills, cp, sob  Physical Exam  BP (!) 216/171 (BP Location: Left Arm)   Pulse (!) 133   Temp 98.7 F (37.1 C) (Oral)   Resp (!) 33   SpO2 95%  Gen:   Awake, no distress   Resp:  Normal effort  MSK:   Moves extremities without difficulty  Other:    Medical Decision Making  Medically screening exam initiated at 11:52 AM.  Appropriate orders placed.  Wendall Isabell was informed that the remainder of the evaluation will be completed by another provider, this initial triage assessment does not replace that evaluation, and the importance of remaining in the ED until their evaluation is complete.     Woodroe Chen 05/06/21 1154    Lorre Nick, MD 05/07/21 [phone]

## 2021-05-06 NOTE — ED Provider Notes (Signed)
[facility]-EMERGENCY DEPT Provider Note   CSN: [id number] Arrival date & time: 05/06/21  1123     History Chief Complaint  Patient presents with   Cough   Shortness of Breath    Harold Bender is a 30 y.o. male.  30 year old male presents with several days of cough and chest tightness.  Also notes some exertional dyspnea as well as orthopnea.  Patient has been off his blood pressure medications for past 2 years due to financial reasons.  Went to his doctor today due to increasing cough as well as blood pressure.  Unable to obtain chest x-ray at his doctor's office and was sent here.  Restarted on his antihypertensives.  Supposedly tested negative for flu and COVID at his doctors today.  Denies any anginal type chest pain.  No new swelling in his lower extremities.  Does have remote history of cocaine use      Past Medical History:  Diagnosis Date   Hypertension     There are no problems to display for this patient.   No past surgical history on file.     No family history on file.  Social History   Tobacco Use   Smoking status: Every Day    Types: Cigarettes  Substance Use Topics   Alcohol use: Yes   Drug use: Yes    Types: Marijuana    Home Medications Prior to Admission medications   Medication Sig Start Date End Date Taking? Authorizing Provider  cetirizine (ZYRTEC) 5 MG tablet Take 5 mg by mouth daily.    [provider]  cyclobenzaprine (FLEXERIL) 5 MG tablet Take 1 tablet (5 mg total) by mouth 3 (three) times daily as needed for muscle spasms. 11/04/15   Charlynne Pander, MD  diclofenac (CATAFLAM) 50 MG tablet Take 1 tablet (50 mg total) by mouth 3 (three) times daily. Prn chest pain 01/28/16   Linna Hoff, MD  fexofenadine-pseudoephedrine (ALLEGRA-D 24) 180-240 MG per 24 hr tablet Take 1 tablet by mouth daily.    [provider]  HYDROcodone-acetaminophen (NORCO/VICODIN) 5-325 MG per tablet Take 1-2 tablets by mouth  every 6 (six) hours as needed for pain. 01/01/13   Santiago Glad, PA-C  ibuprofen (ADVIL,MOTRIN) 800 MG tablet Take 1 tablet (800 mg total) by mouth 3 (three) times daily. 11/04/15   Charlynne Pander, MD  naproxen (NAPROSYN) 500 MG tablet Take 1 tablet (500 mg total) by mouth 2 (two) times daily. 01/01/13   Santiago Glad, PA-C    Allergies    Apple  Review of Systems   Review of Systems  All other systems reviewed and are negative.  Physical Exam Updated Vital Signs BP (!) 205/159   Pulse (!) 130   Temp 98.7 F (37.1 C) (Oral)   Resp (!) 26   SpO2 99%   Physical Exam Vitals and nursing note reviewed.  Constitutional:      General: He is not in acute distress.    Appearance: Normal appearance. He is well-developed. He is not toxic-appearing.  HENT:     Head: Normocephalic and atraumatic.  Eyes:     General: Lids are normal.     Conjunctiva/sclera: Conjunctivae normal.     Pupils: Pupils are equal, round, and reactive to light.  Neck:     Thyroid: No thyroid mass.     Trachea: No tracheal deviation.  Cardiovascular:     Rate and Rhythm: Regular rhythm. Tachycardia present.     Heart sounds: Normal  heart sounds. No murmur heard.   No gallop.  Pulmonary:     Effort: Pulmonary effort is normal. No respiratory distress.     Breath sounds: No stridor. Decreased breath sounds and rhonchi present. No wheezing or rales.  Abdominal:     General: There is no distension.     Palpations: Abdomen is soft.     Tenderness: There is no abdominal tenderness. There is no rebound.  Musculoskeletal:        General: No tenderness. Normal range of motion.     Cervical back: Normal range of motion and neck supple.  Skin:    General: Skin is warm and dry.     Findings: No abrasion or rash.  Neurological:     Mental Status: He is alert and oriented to person, place, and time. Mental status is at baseline.     GCS: GCS eye subscore is 4. GCS verbal subscore is 5. GCS motor subscore is  6.     Cranial Nerves: No cranial nerve deficit.     Sensory: No sensory deficit.     Motor: Motor function is intact.  Psychiatric:        Attention and Perception: Attention normal.        Speech: Speech normal.        Behavior: Behavior normal.    ED Results / Procedures / Treatments   Labs (all labs ordered are listed, but only abnormal results are displayed) Labs Reviewed  BASIC METABOLIC PANEL - Abnormal; Notable for the following components:      Result Value   Potassium 3.4 (*)    CO2 21 (*)    Glucose, Bld 127 (*)    Calcium 8.7 (*)    All other components within normal limits  CULTURE, BLOOD (ROUTINE X 2)  CULTURE, BLOOD (ROUTINE X 2)  RESP PANEL BY RT-PCR (FLU A&B, COVID) ARPGX2  CBC WITH DIFFERENTIAL/PLATELET  LACTIC ACID, PLASMA  BRAIN NATRIURETIC PEPTIDE    EKG None  Radiology DG Chest 2 View  Result Date: 05/06/2021 CLINICAL DATA:  Shortness of breath, cough and chest tightness EXAM: CHEST - 2 VIEW COMPARISON:  01/28/2016 FINDINGS: Cardiomegaly. Mediastinal shadows are normal. There is central bronchial thickening. There is patchy perihilar density right more than left consistent with mild pneumonia. No lobar consolidation or collapse. No effusion. Bony structures unremarkable. IMPRESSION: Bronchitis.  Mild patchy perihilar pneumonia. Electronically Signed   By: Paulina Fusi M.D.   On: 05/06/2021 12:10    Procedures Procedures   Medications Ordered in ED Medications  albuterol (VENTOLIN HFA) 108 (90 Base) MCG/ACT inhaler 2 puff (has no administration in time range)  lactated ringers infusion (has no administration in time range)    ED Course  I have reviewed the triage vital signs and the nursing notes.  Pertinent labs & imaging results that were available during my care of the patient were reviewed by me and considered in my medical decision making (see chart for details).    MDM Rules/Calculators/A&P                           Patient's x-ray  consistent with pneumonia.  Patient hypertensive here initially given Cardizem with minimal response.  Patient subsequently started on Cardene drip after receiving clonidine.  Discussed with hospitalist and patient to be admitted  CRITICAL CARE Performed by: Toy Baker Total critical care time: 45 minutes Critical care time was exclusive of separately billable procedures  and treating other patients. Critical care was necessary to treat or prevent imminent or life-threatening deterioration. Critical care was time spent personally by me on the following activities: development of treatment plan with patient and/or surrogate as well as nursing, discussions with consultants, evaluation of patient's response to treatment, examination of patient, obtaining history from patient or surrogate, ordering and performing treatments and interventions, ordering and review of laboratory studies, ordering and review of radiographic studies, pulse oximetry and re-evaluation of patient's condition.  Final Clinical Impression(s) / ED Diagnoses Final diagnoses:  None    Rx / DC Orders ED Discharge Orders     None        Lorre Nick, MD 05/06/21 1512

## 2021-05-06 NOTE — ED Triage Notes (Addendum)
C/o cough and shob    Reports chest tightness and abdominal tightness from coughing.   Pt reports he has been off his BP meds for 2 years. Just started going to a PCP today who prescribed Bp meds he has not picked up yet.   Tested negative for flu and covid PCP.    [facility] in triage

## 2021-05-06 NOTE — Assessment & Plan Note (Signed)
-  No prior baseline available -No obvious edema or volume overload -Given cocaine use, prolonged QT, and uncontrolled HTN, will order Echo for further evaluation

## 2021-05-06 NOTE — Assessment & Plan Note (Signed)
-  Daily marijuana use with heavy weekend ETOH/tobacco/cocaine use -Cessation encouraged; this should be encouraged on an ongoing basis -UDS ordered -Tobacco Dependence: encourage cessation; this was discussed with the patient and should be reviewed on an ongoing basis.   -Patch ordered at patient request.

## 2021-05-07 ENCOUNTER — Inpatient Hospital Stay (HOSPITAL_COMMUNITY): Payer: BC Managed Care – PPO

## 2021-05-07 DIAGNOSIS — R079 Chest pain, unspecified: Secondary | ICD-10-CM

## 2021-05-07 DIAGNOSIS — I10 Essential (primary) hypertension: Secondary | ICD-10-CM

## 2021-05-07 DIAGNOSIS — R0602 Shortness of breath: Secondary | ICD-10-CM

## 2021-05-07 DIAGNOSIS — R7989 Other specified abnormal findings of blood chemistry: Secondary | ICD-10-CM

## 2021-05-07 DIAGNOSIS — R9431 Abnormal electrocardiogram [ECG] [EKG]: Secondary | ICD-10-CM

## 2021-05-07 DIAGNOSIS — I161 Hypertensive emergency: Secondary | ICD-10-CM

## 2021-05-07 DIAGNOSIS — F1910 Other psychoactive substance abuse, uncomplicated: Secondary | ICD-10-CM

## 2021-05-07 LAB — CBC
HCT: 43.3 % (ref 39.0–52.0)
Hemoglobin: 14.4 g/dL (ref 13.0–17.0)
MCH: 29 pg (ref 26.0–34.0)
MCHC: 33.3 g/dL (ref 30.0–36.0)
MCV: 87.3 fL (ref 80.0–100.0)
Platelets: 131 10*3/uL — ABNORMAL LOW (ref 150–400)
RBC: 4.96 MIL/uL (ref 4.22–5.81)
RDW: 14.3 % (ref 11.5–15.5)
WBC: 6.4 10*3/uL (ref 4.0–10.5)
nRBC: 0 % (ref 0.0–0.2)

## 2021-05-07 LAB — BASIC METABOLIC PANEL
Anion gap: 9 (ref 5–15)
BUN: 9 mg/dL (ref 6–20)
CO2: 22 mmol/L (ref 22–32)
Calcium: 8.7 mg/dL — ABNORMAL LOW (ref 8.9–10.3)
Chloride: 108 mmol/L (ref 98–111)
Creatinine, Ser: 1.17 mg/dL (ref 0.61–1.24)
GFR, Estimated: >60 mL/min (ref >60)
Glucose, Bld: 102 mg/dL — ABNORMAL HIGH (ref 70–99)
Potassium: 4.1 mmol/L (ref 3.5–5.1)
Sodium: 139 mmol/L (ref 135–145)

## 2021-05-07 LAB — HEPATIC FUNCTION PANEL
ALT: 23 U/L (ref 0–44)
AST: 40 U/L (ref 15–41)
Albumin: 3.4 g/dL — ABNORMAL LOW (ref 3.5–5.0)
Alkaline Phosphatase: 53 U/L (ref 38–126)
Bilirubin, Direct: 0.6 mg/dL — ABNORMAL HIGH (ref 0.0–0.2)
Indirect Bilirubin: 1.6 mg/dL — ABNORMAL HIGH (ref 0.3–0.9)
Total Bilirubin: 2.2 mg/dL — ABNORMAL HIGH (ref 0.3–1.2)
Total Protein: 6.7 g/dL (ref 6.5–8.1)

## 2021-05-07 LAB — ECHOCARDIOGRAM COMPLETE
Calc EF: 37.7 %
Height: 71 in
S' Lateral: 4.7 cm
Single Plane A2C EF: 39.3 %
Single Plane A4C EF: 37.8 %
Weight: 4236.36 oz

## 2021-05-07 LAB — TROPONIN I (HIGH SENSITIVITY)
Troponin I (High Sensitivity): 46 ng/L — ABNORMAL HIGH (ref <18)
Troponin I (High Sensitivity): 35 ng/L — ABNORMAL HIGH (ref <18)

## 2021-05-07 LAB — MAGNESIUM: Magnesium: 2 mg/dL (ref 1.7–2.4)

## 2021-05-07 LAB — HIV ANTIBODY (ROUTINE TESTING W REFLEX): HIV Screen 4th Generation wRfx: NONREACTIVE

## 2021-05-07 LAB — PHOSPHORUS: Phosphorus: 3.4 mg/dL (ref 2.5–4.6)

## 2021-05-07 MED ORDER — NITROGLYCERIN IN D5W 200-5 MCG/ML-% IV SOLN
0.0000 ug/min | INTRAVENOUS | Status: DC
Start: 2021-05-07 — End: 2021-05-09
  Administered 2021-05-07: 5 ug/min via INTRAVENOUS
  Administered 2021-05-08: 10:00:00 200 ug/min via INTRAVENOUS
  Administered 2021-05-08: 195 ug/min via INTRAVENOUS
  Administered 2021-05-08: 05:00:00 200 ug/min via INTRAVENOUS
  Administered 2021-05-08: 15:00:00 195 ug/min via INTRAVENOUS
  Administered 2021-05-08: 190 ug/min via INTRAVENOUS
  Administered 2021-05-08: 20:00:00 195 ug/min via INTRAVENOUS
  Administered 2021-05-09: 95 ug/min via INTRAVENOUS
  Administered 2021-05-09: 195 ug/min via INTRAVENOUS
  Filled 2021-05-07 (×8): qty 250

## 2021-05-07 MED ORDER — SODIUM CHLORIDE 0.9 % IV SOLN
100.0000 mg | Freq: Two times a day (BID) | INTRAVENOUS | Status: DC
  Administered 2021-05-07 – 2021-05-10 (×7): 100 mg via INTRAVENOUS
  Filled 2021-05-07 (×7): qty 100

## 2021-05-07 MED ORDER — FUROSEMIDE 10 MG/ML IJ SOLN
40.0000 mg | Freq: Every day | INTRAMUSCULAR | Status: DC
  Administered 2021-05-07 – 2021-05-10 (×4): 40 mg via INTRAVENOUS
  Filled 2021-05-07 (×4): qty 4

## 2021-05-07 MED ORDER — SPIRONOLACTONE 25 MG PO TABS
25.0000 mg | ORAL_TABLET | Freq: Every day | ORAL | Status: DC
  Administered 2021-05-07 – 2021-05-10 (×4): 25 mg via ORAL
  Filled 2021-05-07 (×3): qty 1

## 2021-05-07 MED ORDER — ENOXAPARIN SODIUM 60 MG/0.6ML IJ SOSY
60.0000 mg | PREFILLED_SYRINGE | INTRAMUSCULAR | Status: DC
  Administered 2021-05-07 – 2021-05-09 (×3): 60 mg via SUBCUTANEOUS
  Filled 2021-05-07 (×4): qty 0.6

## 2021-05-07 MED ORDER — LOSARTAN POTASSIUM 50 MG PO TABS
50.0000 mg | ORAL_TABLET | Freq: Every day | ORAL | Status: DC
  Administered 2021-05-07 – 2021-05-08 (×2): 50 mg via ORAL
  Filled 2021-05-07 (×2): qty 1

## 2021-05-07 MED ORDER — HYDROXYZINE HCL 25 MG PO TABS
25.0000 mg | ORAL_TABLET | Freq: Three times a day (TID) | ORAL | Status: DC | PRN
  Administered 2021-05-07 – 2021-05-09 (×6): 25 mg via ORAL
  Filled 2021-05-07 (×6): qty 1

## 2021-05-07 MED ORDER — PERFLUTREN LIPID MICROSPHERE
1.0000 mL | INTRAVENOUS | Status: AC | PRN
  Administered 2021-05-07: 5 mL via INTRAVENOUS
  Filled 2021-05-07: qty 10

## 2021-05-07 MED ORDER — LABETALOL HCL 5 MG/ML IV SOLN
10.0000 mg | INTRAVENOUS | Status: DC | PRN
  Administered 2021-05-07: 10 mg via INTRAVENOUS
  Filled 2021-05-07 (×2): qty 4

## 2021-05-07 MED ORDER — AMLODIPINE BESYLATE 10 MG PO TABS
10.0000 mg | ORAL_TABLET | Freq: Every day | ORAL | Status: DC
Start: 2021-05-07 — End: 2021-05-10
  Administered 2021-05-07 – 2021-05-10 (×4): 10 mg via ORAL
  Filled 2021-05-07 (×4): qty 1

## 2021-05-07 NOTE — Progress Notes (Signed)
PROGRESS NOTE    Harold Bender  ZOX:[id number] DOB: May 15, 1991 DOA: 05/06/2021 PCP: Patient, No Pcp Per (Inactive)   Brief Narrative:  Patient is a 30 year old African-American obese male with past medical history significant for but limited to hypertension noncompliance of medications as well as other comorbidities who presented with a cough.  He reports that the cough is recurrent.  For last 2 days it was unbearable and a chest and abdominal pain.  Also has not taken his blood pressure medication up to 3 years given that he is unable to afford.  He had a mild fever Monday briefly.  Last stated that he started having coughing the night before last.  And chest pain with coughing will radiate into his ribs.  He presented to urgent care and was given Cozaar.  He is then brought to the ED and had a cough and shortness of breath.  He had a pneumonia on chest x-ray and profoundly elevated blood pressure in the setting of cocaine.  He started on antibiotics and was given a dose of Cardizem and clonidine.  He was admitted for hypertensive urgency and uncontrolled hypertension and was found to have an elevated BNP.  Because he continued to have elevated blood pressures cardiology was consulted for further evaluation and have initiated the patient on a nitro glycerin drip.  Assessment & Plan:   Principal Problem:   Multifocal pneumonia Active Problems:   Uncontrolled hypertension   Elevated brain natriuretic peptide (BNP) level   Prolonged QT interval   Polysubstance abuse (HCC)  Multifocal PNA -Presented with a Cough, Subjective Fever, Tachypnea and Patchy Perihilar Infiltrates on CXR -Appears to be most likely a CAP but possibly viral -Influenza A and COVID-19 Negative -Ordering Gram Stain and Cx, GAS, and Lower Respiratory Tract PCT Level -CURB-65 is 1-2 and was admitted for further Evaluation and Treatment  -PSI was Class 2 -Started on Azithromycin and Ceftriaxone  -C/w Antipyretics -Added  Albuterol 2 puff IH q2h PRN and Guaifenesin 600 mg po BIDprn -Repeat CXR in the AM and will need an Ambulatory Home O2 Screen prior to D/C   Uncontrolled HTN/Hypertensive Urgency -Checking Troponin and initial was 46 -Known history of hypertension has not taken medication for several years and was on olmesartan previously but would not combine it due to lack of insurance and cost of medication -He now has insurance and is planning to start taking medications -Was given Cozaar at the urgent care yesterday morning and took his first dose -Increase his Cozaar dose to 50 mg p.o. daily -He was given Cardizem and clonidine in the ED without significant improvement -He was added on as needed hydralazine as well as as needed labetalol -Cardiology's been consulted and his blood pressure was as high as 232/148 on admission -Initiated on amlodipine 10 mg p.o. daily -Cardiology is recommending starting the patient on IV nitro drip given his systolic pressures in the 200s and given his elevated BNP and symptoms of CHF and also started IV Lasix 40 mg daily as well as spironolactone 25 mg p.o. daily -Cardiology feels that he has some untreated sleep apnea and recommending outpatient sleep study -Cardiology is also considering renal artery ultrasound and importance of cessation of tobacco and cocaine use  Suspected sleep apnea -Has admitted to snoring and wife has described apneic episode -The patient will need outpatient sleep study  Polysubstance Abuse -Patient admitted Daily marijuana use with heavy weekend ETOH/tobacco/cocaine use -Cessation encouraged; this should be encouraged on an ongoing basis; Patient admits  to using Cocaine 2 weeks ago -UDS ordered  Tobacco Dependence -Encouraged cessation; this was discussed with the patient and should be reviewed on an ongoing basis.   -Patch ordered at patient request.  Prolonged QT -Likely assosciated with Infection from Multifocal PNA but could be  related to drug use -Initial EKG showed a QTC of 618 ms -Avoid QT Prolonging medications including but not limited to PPI, Antiemetics, and SSRIs -Repeat EKG this AM has normalized to 470 on repeat -Correct electrolytes including potassium and magnesium; potassium was 3.4 yesterday and is now 4.1 today and magnesium is 2.0   Elevated BNP with Suspected Hypertensive Cardiomyopathy  -No prior Baseline avilable but was 1,019.0 on Admisison -No overt signs of Volume overload or edema -Given Cocaine Use, Prolonged QT, and Uncontrolled HTN, Echo has been ordered for further evaluation and Recommendations  -Cardiology consulted for further evaluation and management patient did endorse some CHF symptoms including dyspnea, orthopnea and PND as well as abdominal distention. -Cardiology suspect some hypertensive cardiomyopathy and initiated the patient on IV Lasix 40 mg p.o. daily -They are recommending strict I's and O's and daily weights and continue to monitor his renal function carefully  Hypokalemia -Improved. K+ went from 3.4 -> 4.1 -Continue to Monitor and Trend -Repeat CMP in the AM  Thrombocytopenia -Platelet count went from 156 and trended down to 131 -Continue to monitor and trend and continue to monitor for signs and symptoms of bleeding; currently no overt bleeding noted -Repeat CBC in a.m.  Hyperbilirubinemia -Elevated and Likely reactive -T Bili today was 2.2 (Direct was 0.6 and Indirect was 1.6) -Continue to Monitor and Trend -Repeat CMP in the AM   Obesity -Complicates overall prognosis and care -Estimated body mass index is 36.93 kg/m as calculated from the following:   Height as of this encounter: 5\' 11"  (1.803 m).   Weight as of this encounter: 120.1 kg. -Weight Loss and Dietary Counseling given   DVT prophylaxis: Anticoagulated with Enoxaparin 40 mg sq q24h Code Status: FULL CODE  Family Communication: Discussed with wife as a bedside  Disposition Plan: Pending  further clinical improvement   Status is: Inpatient  Remains inpatient appropriate because: Patient continues to have uncontrolled high blood pressure and is now on a nitro drip with cardiology following and diuresing.  Consultants:  Cardiology  Procedures:  ECHOCARDIOGRAM   Antimicrobials:  Anti-infectives (From admission, onward)    Start     Dose/Rate Route Frequency Ordered Stop   05/06/21 1500  cefTRIAXone (ROCEPHIN) 1 g in sodium chloride 0.9 % 100 mL IVPB        1 g 200 mL/hr over 30 Minutes Intravenous Every 24 hours 05/06/21 1445     05/06/21 1500  azithromycin (ZITHROMAX) 500 mg in sodium chloride 0.9 % 250 mL IVPB        500 mg 250 mL/hr over 60 Minutes Intravenous Every 24 hours 05/06/21 1445          Subjective: In and examined at bedside and was still little short of breath intermittently.  Blood pressure still elevated.  No nausea or vomiting.  Does have some chest pain and does have a cough.  No other concerns or complaints at this time.  Objective: Vitals:   05/07/21 0500 05/07/21 0600 05/07/21 0700 05/07/21 0803  BP: (!) 198/129 (!) 183/133 (!) 186/131   Pulse: (!) 108 (!) 111 (!) 113   Resp: (!) 21 (!) 23 18   Temp:    98.6 F (37 C)  TempSrc:    Oral  SpO2: 93% 100% 99%   Weight:      Height:        Intake/Output Summary (Last 24 hours) at 05/07/2021 0809 Last data filed at 05/07/2021 0600 Gross per 24 hour  Intake 1038.71 ml  Output 1302 ml  Net -263.29 ml   Filed Weights   05/06/21 1801  Weight: 120.1 kg   Examination: Physical Exam:  Constitutional: WN/WD obese AAM in NAD and appears calm and comfortable Eyes: Lids and conjunctivae normal, sclerae anicteric  ENMT: External Ears, Nose appear normal. Grossly normal hearing. Mucous membranes are moist.  Neck: Appears normal, supple, no cervical masses, normal ROM, no appreciable thyromegaly; no appreciable JVD Respiratory: Diminished to auscultation bilaterally, no wheezing, rales,  rhonchi or crackles. Normal respiratory effort and patient is not tachypenic. No accessory muscle use.  Unlabored breathing Cardiovascular: RRR, no murmurs / rubs / gallops. S1 and S2 auscultated.  Trace extremity edema.  Abdomen: Soft, non-tender, distended secondary body habitus. Bowel sounds positive.  GU: Deferred. Musculoskeletal: No clubbing / cyanosis of digits/nails. No joint deformity upper and lower extremities.  Skin: No rashes, lesions, ulcers on limited skin evaluation. No induration; Warm and dry.  Neurologic: CN 2-12 grossly intact with no focal deficits. Romberg sign and cerebellar reflexes not assessed.  Psychiatric: Normal judgment and insight. Alert and oriented x 3. Normal mood and appropriate affect.   Data Reviewed: I have personally reviewed following labs and imaging studies  CBC: Recent Labs  Lab 05/06/21 1154 05/07/21 0313  WBC 6.4 6.4  NEUTROABS 5.3  --   HGB 14.4 14.4  HCT 41.9 43.3  MCV 85.9 87.3  PLT 156 131*   Basic Metabolic Panel: Recent Labs  Lab 05/06/21 1154 05/07/21 0313  NA 137 139  K 3.4* 4.1  CL 107 108  CO2 21* 22  GLUCOSE 127* 102*  BUN 12 9  CREATININE 1.19 1.17  CALCIUM 8.7* 8.7*   GFR: Estimated Creatinine Clearance: 119.5 mL/min (by C-G formula based on SCr of 1.17 mg/dL). Liver Function Tests: No results for input(s): AST, ALT, ALKPHOS, BILITOT, PROT, ALBUMIN in the last 168 hours. No results for input(s): LIPASE, AMYLASE in the last 168 hours. No results for input(s): AMMONIA in the last 168 hours. Coagulation Profile: No results for input(s): INR, PROTIME in the last 168 hours. Cardiac Enzymes: No results for input(s): CKTOTAL, CKMB, CKMBINDEX, TROPONINI in the last 168 hours. BNP (last 3 results) No results for input(s): PROBNP in the last 8760 hours. HbA1C: No results for input(s): HGBA1C in the last 72 hours. CBG: No results for input(s): GLUCAP in the last 168 hours. Lipid Profile: No results for input(s):  CHOL, HDL, LDLCALC, TRIG, CHOLHDL, LDLDIRECT in the last 72 hours. Thyroid Function Tests: No results for input(s): TSH, T4TOTAL, FREET4, T3FREE, THYROIDAB in the last 72 hours. Anemia Panel: No results for input(s): VITAMINB12, FOLATE, FERRITIN, TIBC, IRON, RETICCTPCT in the last 72 hours. Sepsis Labs: Recent Labs  Lab 05/06/21 1154 05/06/21 1320  PROCALCITON 0.14  --   LATICACIDVEN  --  1.2    Recent Results (from the past 240 hour(s))  Resp Panel by RT-PCR (Flu A&B, Covid) Nasopharyngeal Swab     Status: None   Collection Time: 05/06/21  1:20 PM   Specimen: Nasopharyngeal Swab; Nasopharyngeal(NP) swabs in vial transport medium  Result Value Ref Range Status   SARS Coronavirus 2 by RT PCR NEGATIVE NEGATIVE Final    Comment: (NOTE) SARS-CoV-2 target nucleic acids  are NOT DETECTED.  The SARS-CoV-2 RNA is generally detectable in upper respiratory specimens during the acute phase of infection. The lowest concentration of SARS-CoV-2 viral copies this assay can detect is 138 copies/mL. A negative result does not preclude SARS-Cov-2 infection and should not be used as the sole basis for treatment or other patient management decisions. A negative result may occur with  improper specimen collection/handling, submission of specimen other than nasopharyngeal swab, presence of viral mutation(s) within the areas targeted by this assay, and inadequate number of viral copies(<138 copies/mL). A negative result must be combined with clinical observations, patient history, and epidemiological information. The expected result is Negative.  Fact Sheet for Patients:  BloggerCourse.com  Fact Sheet for Healthcare Providers:  SeriousBroker.it  This test is no t yet approved or cleared by the [place] FDA and  has been authorized for detection and/or diagnosis of SARS-CoV-2 by FDA under an Emergency Use Authorization (EUA). This EUA will  remain  in effect (meaning this test can be used) for the duration of the COVID-19 declaration under Section 564(b)(1) of the Act, 21 U.S.C.section 360bbb-3(b)(1), unless the authorization is terminated  or revoked sooner.       Influenza A by PCR NEGATIVE NEGATIVE Final   Influenza B by PCR NEGATIVE NEGATIVE Final    Comment: (NOTE) The Xpert Xpress SARS-CoV-2/FLU/RSV plus assay is intended as an aid in the diagnosis of influenza from Nasopharyngeal swab specimens and should not be used as a sole basis for treatment. Nasal washings and aspirates are unacceptable for Xpert Xpress SARS-CoV-2/FLU/RSV testing.  Fact Sheet for Patients: BloggerCourse.com  Fact Sheet for Healthcare Providers: SeriousBroker.it  This test is not yet approved or cleared by the [place] FDA and has been authorized for detection and/or diagnosis of SARS-CoV-2 by FDA under an Emergency Use Authorization (EUA). This EUA will remain in effect (meaning this test can be used) for the duration of the COVID-19 declaration under Section 564(b)(1) of the Act, 21 U.S.C. section 360bbb-3(b)(1), unless the authorization is terminated or revoked.  Performed at [facility], [address] [place], [place] [address]   MRSA Next Gen by PCR, Nasal     Status: None   Collection Time: 05/06/21  6:03 PM   Specimen: Nasal Mucosa; Nasal Swab  Result Value Ref Range Status   MRSA by PCR Next Gen NOT DETECTED NOT DETECTED Final    Comment: (NOTE) The GeneXpert MRSA Assay (FDA approved for NASAL specimens only), is one component of a comprehensive MRSA colonization surveillance program. It is not intended to diagnose MRSA infection nor to guide or monitor treatment for MRSA infections. Test performance is not FDA approved in patients less than 69 years old. Performed at [facility], [address], [place], [place] [address]      RN Pressure Injury Documentation:     Estimated body mass index is 36.93 kg/m as calculated from the following:   Height as of this encounter: 5\' 11"  (1.803 m).   Weight as of this encounter: 120.1 kg.  Malnutrition Type:   Malnutrition Characteristics:   Nutrition Interventions:    Radiology Studies: DG Chest 2 View  Result Date: 05/06/2021 CLINICAL DATA:  Shortness of breath, cough and chest tightness EXAM: CHEST - 2 VIEW COMPARISON:  01/28/2016 FINDINGS: Cardiomegaly. Mediastinal shadows are normal. There is central bronchial thickening. There is patchy perihilar density right more than left consistent with mild pneumonia. No lobar consolidation or collapse. No effusion. Bony structures unremarkable.  IMPRESSION: Bronchitis.  Mild patchy perihilar pneumonia. Electronically Signed   By: Paulina Fusi M.D.   On: 05/06/2021 12:10    Scheduled Meds:  amLODipine  10 mg Oral Daily   Chlorhexidine Gluconate Cloth  6 each Topical Daily   docusate sodium  100 mg Oral BID   enoxaparin (LOVENOX) injection  40 mg Subcutaneous Q24H   losartan  50 mg Oral Daily   nicotine  7 mg Transdermal Daily   sodium chloride flush  3 mL Intravenous Q12H   Continuous Infusions:  azithromycin Stopped (05/06/21 1657)   cefTRIAXone (ROCEPHIN)  IV Stopped (05/06/21 1549)    LOS: 1 day   Merlene Laughter, DO [facility] PAGER is on AMION  If 7PM-7AM, please contact night-coverage [email or web address]

## 2021-05-07 NOTE — TOC Initial Note (Signed)
Transition of Care [facility]) - Initial/Assessment Note    Patient Details  Name: Harold Bender MRN: [id number] Date of Birth: April 07, 1991  Transition of Care [facility]) CM/SW Contact:    Lanier Clam, RN Phone Number: 05/07/2021, 9:43 AM  Clinical Narrative: Monitor for d/c plans.                  Expected Discharge Plan: Home/Self Care Barriers to Discharge: Continued Medical Work up   Patient Goals and CMS Choice Patient states their goals for this hospitalization and ongoing recovery are:: go home      Expected Discharge Plan and Services Expected Discharge Plan: Home/Self Care   Discharge Planning Services: CM Consult   Living arrangements for the past 2 months: Single Family Home                                      Prior Living Arrangements/Services Living arrangements for the past 2 months: Single Family Home Lives with:: Spouse Patient language and need for interpreter reviewed:: Yes Do you feel safe going back to the place where you live?: Yes      Need for Family Participation in Patient Care: No (Comment) Care giver support system in place?: Yes (comment)   Criminal Activity/Legal Involvement Pertinent to Current Situation/Hospitalization: No - Comment as needed  Activities of Daily Living      Permission Sought/Granted Permission sought to share information with : Case Manager Permission granted to share information with : Yes, Verbal Permission Granted  Share Information with NAME: Case Manager     Permission granted to share info w Relationship: Chelsy spouse [phone]     Emotional Assessment Appearance:: Appears stated age Attitude/Demeanor/Rapport: Gracious Affect (typically observed): Accepting Orientation: : Oriented to Self, Oriented to Place, Oriented to  Time, Oriented to Situation Alcohol / Substance Use: Not Applicable Psych Involvement: No (comment)  Admission diagnosis:  Shortness of breath [R06.02] Multifocal pneumonia  [J18.9] Patient Active Problem List   Diagnosis Date Noted   Multifocal pneumonia 05/06/2021   Uncontrolled hypertension 05/06/2021   Elevated brain natriuretic peptide (BNP) level 05/06/2021   Prolonged QT interval 05/06/2021   Polysubstance abuse (HCC) 05/06/2021   PCP:  Patient, No Pcp Per (Inactive) Pharmacy:   [id number] [place], [place] - [address] [place] [address] Phone: [phone] Fax: [phone]     Social Determinants of Health (SDOH) Interventions    Readmission Risk Interventions No flowsheet data found.

## 2021-05-07 NOTE — Consult Note (Addendum)
Cardiology Consultation:   Patient ID: Harold Bender MRN: [id number]; DOB: 1991/04/24  Admit date: 05/06/2021 Date of Consult: 05/07/2021  PCP:  Patient, No Pcp Per (Inactive)   [facility] Providers Cardiologist: New (Dr. Shari Prows)  Patient Profile:   Harold Bender is a 30 y.o. male with a history of hypertension and polysubstance abuse who is being seen today for the evaluation of chest pain and uncontrolled hypertension at the request of Dr. Marland Mcalpine.  History of Present Illness:   Harold Bender is a 30 year old male with the above history. No prior cardiac history.  Has a history of hypertension but no known diabetes or hyperlipidemia.  Has a family history of hypertension in his father and stroke in a maternal uncle but no known family history of heart disease.  He has smoked since the age of 24 and states he usually smokes about 1 pack of cigarettes on the weekends but usually does not smoke during the week.  He also reports alcohol use and states he drinks heavily on the weekends.  He also endorses marijuana and cocaine use.  He smokes marijuana on a daily basis.  He last used cocaine about 2 weeks ago.  Patient presented to the ED on 05/06/2021 with multiple complaints including cough, shortness of breath, chest tightness, and abdominal tightness.  Patient states he started having a productive cough and nasal congestion a couple a couple of weeks ago and it has just been getting progressively worse.  He then started to notice some shortness of breath over the last week.  He also describes orthopnea and PND although he does snore and wife describe some apneic episodes so this may be more sleep apnea.  He denies any lower extremity swelling but does state his abdomen has been more distended lately.  He does have some mild chest/abdominal discomfort due to all of his coughing but no other recent chest pain. He states he has felt like his heart has been racing for the last week and reports  occasional lightheadedness/dizziness during coughing spells. No syncope. No other recent illnesses. No GI symptoms.  Upon arrival to the ED, patient markedly hypertensive with BP of 232/148. EKG showed sinus tachycardia, rate 130 bpm, with slight ST depressions in leads II and V5-V6 and prolonged Qtc of 618 ms. BNP elevated at 1,019. Chest x-ray showed bronchitis and mild patchy perihilar pneumonia. WBC 6.4, Hgb 14.4, Plts 156. Na 137, K 3.4, Glucose 127, BUN 12, Cr 1.19. Lactic acid norma. Procalcitonin normal. Respiratory panel negative for COVID-19 and influenza A/B. Patient was given Cardizem and Clonidine in the ED without significant improvement in his BP. Patient was started on antibiotics for pneumonia and was BP medications were adjusted. Cardiology was consulted for further evaluation of chest pain and uncontrolled hypertension.  At the time of this evaluation, patient resting comfortably in no acute distress. He states he was previously on Olmesartan for his hypertension but has not been taking any medications in the last 2 years due to cost. However, he states he is in a better financial state now and will be able to afford medications. Discussed polysubstance abuse above. Patient recently had a death in the family and states he has been under a lot of stress. He knows he has not been handling all of this well and states "I take full responsibility." He seem very motivated to change.  Past Medical History:  Diagnosis Date   Hypertension     Past Surgical History:  Procedure Laterality Date  ANKLE SURGERY Left      Home Medications:  Prior to Admission medications   Medication Sig Start Date End Date Taking? Authorizing Provider  diphenhydrAMINE (BENADRYL) 25 mg capsule Take 25 mg by mouth every 8 (eight) hours as needed for allergies.   Yes [provider]  vitamin C (ASCORBIC ACID) 500 MG tablet Take 500 mg by mouth daily.   Yes [provider]  losartan (COZAAR)  25 MG tablet Take 25 mg by mouth daily. 05/06/21   [provider]    Inpatient Medications: Scheduled Meds:  amLODipine  10 mg Oral Daily   Chlorhexidine Gluconate Cloth  6 each Topical Daily   docusate sodium  100 mg Oral BID   enoxaparin (LOVENOX) injection  40 mg Subcutaneous Q24H   losartan  50 mg Oral Daily   nicotine  7 mg Transdermal Daily   sodium chloride flush  3 mL Intravenous Q12H   Continuous Infusions:  azithromycin Stopped (05/06/21 1657)   cefTRIAXone (ROCEPHIN)  IV Stopped (05/06/21 1549)   PRN Meds: acetaminophen **OR** acetaminophen, albuterol, bisacodyl, guaiFENesin, hydrALAZINE, hydrOXYzine, labetalol, morphine injection, oxyCODONE, polyethylene glycol  Allergies:    Allergies  Allergen Reactions   Apple Nausea And Vomiting    Social History:   Social History   Socioeconomic History   Marital status: Single    Spouse name: Not on file   Number of children: Not on file   Years of education: Not on file   Highest education level: Not on file  Occupational History   Occupation: insurance  Tobacco Use   Smoking status: Some Days    Types: Cigarettes   Smokeless tobacco: Never   Tobacco comments:    With alcohol  Substance and Sexual Activity   Alcohol use: Yes    Comment: binge drinking - 7-10 drinks on weekned nights   Drug use: Yes    Types: Marijuana, Cocaine    Comment: daily marijuana use; last used cocaine 2 weeks ago   Sexual activity: Yes    Birth control/protection: None  Other Topics Concern   Not on file  Social History Narrative   Not on file   Social Determinants of Health   Financial Resource Strain: Not on file  Food Insecurity: Not on file  Transportation Needs: Not on file  Physical Activity: Not on file  Stress: Not on file  Social Connections: Not on file  Intimate Partner Violence: Not on file    Family History:   History reviewed. No pertinent family history.   ROS:  Please see the history of present  illness.  All other ROS reviewed and negative.     Physical Exam/Data:   Vitals:   05/07/21 0500 05/07/21 0600 05/07/21 0700 05/07/21 0803  BP: (!) 198/129 (!) 183/133 (!) 186/131   Pulse: (!) 108 (!) 111 (!) 113   Resp: (!) 21 (!) 23 18   Temp:    98.6 F (37 C)  TempSrc:    Oral  SpO2: 93% 100% 99%   Weight:      Height:        Intake/Output Summary (Last 24 hours) at 05/07/2021 0932 Last data filed at 05/07/2021 0600 Gross per 24 hour  Intake 1038.71 ml  Output 1302 ml  Net -263.29 ml   Last 3 Weights 05/06/2021  Weight (lbs) 264 lb 12.4 oz  Weight (kg) 120.1 kg     Body mass index is 36.93 kg/m.  General: 29 y.o. obese African-American male resting comfortably  in no acute distress. HEENT: Normocephalic and atraumatic. Sclera clear.  Neck: Supple. No carotid bruits. No JVD. Heart: Tachycardic with regular rhythm. Distinct S1 and S2. No murmurs, gallops, or rubs.  Lungs: No increased work of breathing. Clear to ausculation bilaterally. No significant wheezes, rhonchi, or rales.  Abdomen: Soft, non-distended, and non-tender to palpation.  MSK: Normal strength and tone for age. Extremities: Trace lower extremity edema bilaterally. Skin: Warm and dry. Neuro: Alert and oriented x3. No focal deficits. Psych: Normal affect. Responds appropriately.  EKG:  The following EKGs were personally reviewed: - EKG from 05/06/2021 at 11:35 showed sinus tachycardia, rate 130 bpm, with slight ST depressions in leads II and V5-V6. Right axis deviation. Prolonged QTc of 618 ms. - EKG from 05/06/2021 at 20:50 showed sinus tachycardia, rate 121 bpm, with no acute ST/T changes (ST depression resolved). Normal axis. Normal PR and QRS intervals. QTc normalized to 457 ms. - EKG from 05/07/2021 at 6:32 showed sinus tachycardia, rate 113 bpm, with non-specific ST/T changes. Normal axis. Normal PR and QRS. QTc 474 ms.  Telemetry:  Telemetry was personally reviewed and demonstrates: Sinus  tachycardia with rates in the low 100s.  Relevant CV Studies:  Echo pending.  Laboratory Data:  High Sensitivity Troponin:  No results for input(s): TROPONINIHS in the last 720 hours.   Chemistry Recent Labs  Lab 05/06/21 1154 05/07/21 0246 05/07/21 0313  NA 137  --  139  K 3.4*  --  4.1  CL 107  --  108  CO2 21*  --  22  GLUCOSE 127*  --  102*  BUN 12  --  9  CREATININE 1.19  --  1.17  CALCIUM 8.7*  --  8.7*  MG  --  2.0  --   GFRNONAA >60  --  >60  ANIONGAP 9  --  9    Recent Labs  Lab 05/07/21 0246  PROT 6.7  ALBUMIN 3.4*  AST 40  ALT 23  ALKPHOS 53  BILITOT 2.2*   Lipids No results for input(s): CHOL, TRIG, HDL, LABVLDL, LDLCALC, CHOLHDL in the last 168 hours.  Hematology Recent Labs  Lab 05/06/21 1154 05/07/21 0313  WBC 6.4 6.4  RBC 4.88 4.96  HGB 14.4 14.4  HCT 41.9 43.3  MCV 85.9 87.3  MCH 29.5 29.0  MCHC 34.4 33.3  RDW 14.3 14.3  PLT 156 131*   Thyroid No results for input(s): TSH, FREET4 in the last 168 hours.  BNP Recent Labs  Lab 05/06/21 1154  BNP 1,019.0*    DDimer No results for input(s): DDIMER in the last 168 hours.   Radiology/Studies:  DG Chest 2 View  Result Date: 05/06/2021 CLINICAL DATA:  Shortness of breath, cough and chest tightness EXAM: CHEST - 2 VIEW COMPARISON:  01/28/2016 FINDINGS: Cardiomegaly. Mediastinal shadows are normal. There is central bronchial thickening. There is patchy perihilar density right more than left consistent with mild pneumonia. No lobar consolidation or collapse. No effusion. Bony structures unremarkable. IMPRESSION: Bronchitis.  Mild patchy perihilar pneumonia. Electronically Signed   By: Paulina Fusi M.D.   On: 05/06/2021 12:10     Assessment and Plan:   Hypertensive Emergency - BP as high as 232/148 on admission. Previously on Olmesartan but patient states he has not been taking his medications for the last 2 years due to cost. - Started on Amlodipine 10mg  daily and Losartan 50mg  daily.  -  Will start IV Nitro drip given systolic BP still in the 200s. Given elevated  BNP and symptoms of CHF (orthopnea, PND, abdominal distension), will start IV Lasix 40mg  daily as well as Spironolactone 25mg  daily. - Sounds like patient has untreated sleep apnea. Recommend outpatient sleep study. - Also consider renal artery ultrasound. - Discussed importance of complete cessation of tobacco use and cocaine use.  Elevated BNP Suspected Hypertensive Cardiomyopathy - BNP elevated at 1,019. - Chest x-ray showed no overt edema. - Echo pending. - Patient does does some CHF symptoms including dyspnea, orthopnea, PND, and abdominal distension. He has trace lower extremity edema on exam. Suspect patient likely has some hypertensive cardiomyopathy. - Will start IV Lasix 40mg  daily. - Will monitor daily weight, strict I/Os, and renal function.  Elevated Troponin - Initial high-sensitivity troponin minimally elevated at 46. Will repeat to make sure this stays flat.  - EKG shows no acute changes.  - Patient reports some chest pain/abdominal pain due to all the coughing he has been doing but nothing that sounds like angina. - Echo pending. - Suspect demand ischemia in setting of hypertensive emergency.   Prolonged QTc - Initial EKG showed QTc of but normalized to the 470s on repeat EKGs. - Potassium 3.4 on admission but 4.1 today. - Magnesium 2.0 today. - Avoid QT prolonging medications.  Pneumonia - Chest x-ray consistent with bronchitis and mild patchy perihilar pneumonia. - WBC, Lactic Acid, and Procalcitonin all normal. - Stared on antibiotics. - Management per primary team.  Suspect Sleep Apnea - Patient reports snoring and wife describes apneic episodes. - Suspect patient has obstructive sleep apnea. Explained that this can make hypertension difficult to treat.  - Recommend outpatient sleep study.  Polysubstance Abuse - Patient reports tobacco, alcohol, marijuana, and cocaine use. He  smokes cigarrettes primarily on the weekends but endorses daily marijuana use. He last used cocaine 2 weeks ago. - Discussed the importance of complete cessation. He states he has been grieving the loss of a family member recently and has been under a lot of stress lately. He knows he has not been handling this well and states "I take full responsibility." He seems very motivated to change.   Risk Assessment/Risk Scores:   New York Heart Association (NYHA) Functional Class NYHA Class III   For questions or updates, please contact [facility] Please consult [email or web address] for contact info under    Signed, Smitty Knudsen  05/07/2021 9:33 AM  Patient seen and examined and agree with Marjie Skiff, PA-C as detailed above.   In brief, the patient is a 30 year old male with history of HTN not currently on medications and polysubstance abuse who presented to the ER with chest pain and cough found to be hypertensive with elevated BNP for which Cardiology has been consulted.   Patient states that he developed worsening cough, orthopnea and PND over the past several weeks. Notably, has uncontrolled HTN and has not been on medications for several years due to inability to afford them. Also, has been using cocaine with last use 2 weeks ago. States his chest pain only developed and occurs with coughing. No exertional symptoms. Suspect patient has developed heart failure in the setting of uncontrolled HTN with low suspicion for ACS. Will follow-up TTE and aggressively manage blood pressure.  GEN: No acute distress.   Neck: JVD to angle of the mandible Cardiac: RRR, no murmurs, rubs, or gallops.  Respiratory: Clear to auscultation bilaterally. GI: Soft, nontender, non-distended  MS: Trace LE edema, warm Neuro:  Nonfocal  Psych: Normal affect  Plan: -Follow-up TTE; suspect patient has hypertension induced cardiomyopathy  -Continue nitro gtt for blood pressure control and afterload  reduction -Start lasix 40mg  IV daily and monitor response -Low suspicion for ACS, no urgent need for cath at this time -Continue amlodipine 10mg , losartan 50mg   -Start spiro 25mg  daily -Will need to use nonselective BB in future pending EF given history of cocaine abuse -Discussed importance of abstaining from cocaine -Monitor I/Os and daily weights -Low Na diet -Agree with outpatient sleep study  Laurance Flatten, MD

## 2021-05-07 NOTE — TOC Initial Note (Signed)
Transition of Care [facility]) - Initial/Assessment Note    Patient Details  Name: Harold Bender MRN: [id number] Date of Birth: Oct 22, 1990  [facility]) CM/SW Contact:    Lanier Clam, RN Phone Number: 05/07/2021, 2:16 PM  Clinical Narrative: Has pcp,pharmacy,transport home.Declines substance abuse resources. Continue to monitor for d/c needs.                  Expected Discharge Plan: Home/Self Care Barriers to Discharge: Continued Medical Work up   Patient Goals and CMS Choice Patient states their goals for this hospitalization and ongoing recovery are:: go home      Expected Discharge Plan and Services Expected Discharge Plan: Home/Self Care   Discharge Planning Services: CM Consult   Living arrangements for the past 2 months: Single Family Home                                      Prior Living Arrangements/Services Living arrangements for the past 2 months: Single Family Home Lives with:: Spouse Patient language and need for interpreter reviewed:: Yes Do you feel safe going back to the place where you live?: Yes      Need for Family Participation in Patient Care: No (Comment) Care giver support system in place?: Yes (comment)   Criminal Activity/Legal Involvement Pertinent to Current Situation/Hospitalization: No - Comment as needed  Activities of Daily Living      Permission Sought/Granted Permission sought to share information with : Case Manager Permission granted to share information with : Yes, Verbal Permission Granted  Share Information with NAME: Case Manager     Permission granted to share info w Relationship: Chelsy spouse [phone]     Emotional Assessment Appearance:: Appears stated age Attitude/Demeanor/Rapport: Gracious Affect (typically observed): Accepting Orientation: : Oriented to Self, Oriented to Place, Oriented to  Time, Oriented to Situation Alcohol / Substance Use: Not Applicable Psych Involvement: No  (comment)  Admission diagnosis:  Shortness of breath [R06.02] Multifocal pneumonia [J18.9] Patient Active Problem List   Diagnosis Date Noted   Multifocal pneumonia 05/06/2021   Uncontrolled hypertension 05/06/2021   Elevated brain natriuretic peptide (BNP) level 05/06/2021   Prolonged QT interval 05/06/2021   Polysubstance abuse (HCC) 05/06/2021   PCP:  Patient, No Pcp Per (Inactive) Pharmacy:   [id number] [place], [place] - [address] [address] Phone: [phone] Fax: [phone]     Social Determinants of Health (SDOH) Interventions    Readmission Risk Interventions No flowsheet data found.

## 2021-05-08 DIAGNOSIS — I5041 Acute combined systolic (congestive) and diastolic (congestive) heart failure: Secondary | ICD-10-CM

## 2021-05-08 DIAGNOSIS — I5021 Acute systolic (congestive) heart failure: Secondary | ICD-10-CM

## 2021-05-08 LAB — COMPREHENSIVE METABOLIC PANEL
ALT: 29 U/L (ref 0–44)
AST: 37 U/L (ref 15–41)
Albumin: 3.1 g/dL — ABNORMAL LOW (ref 3.5–5.0)
Alkaline Phosphatase: 47 U/L (ref 38–126)
Anion gap: 9 (ref 5–15)
BUN: 11 mg/dL (ref 6–20)
CO2: 22 mmol/L (ref 22–32)
Calcium: 8.6 mg/dL — ABNORMAL LOW (ref 8.9–10.3)
Chloride: 107 mmol/L (ref 98–111)
Creatinine, Ser: 1.15 mg/dL (ref 0.61–1.24)
GFR, Estimated: >60 mL/min (ref >60)
Glucose, Bld: 107 mg/dL — ABNORMAL HIGH (ref 70–99)
Potassium: 3.1 mmol/L — ABNORMAL LOW (ref 3.5–5.1)
Sodium: 138 mmol/L (ref 135–145)
Total Bilirubin: 0.9 mg/dL (ref 0.3–1.2)
Total Protein: 6.2 g/dL — ABNORMAL LOW (ref 6.5–8.1)

## 2021-05-08 LAB — CBC WITH DIFFERENTIAL/PLATELET
Abs Immature Granulocytes: 0.02 10*3/uL (ref 0.00–0.07)
Basophils Absolute: 0 10*3/uL (ref 0.0–0.1)
Basophils Relative: 1 %
Eosinophils Absolute: 0.2 10*3/uL (ref 0.0–0.5)
Eosinophils Relative: 3 %
HCT: 39.7 % (ref 39.0–52.0)
Hemoglobin: 13.1 g/dL (ref 13.0–17.0)
Immature Granulocytes: 0 %
Lymphocytes Relative: 27 %
Lymphs Abs: 1.9 10*3/uL (ref 0.7–4.0)
MCH: 29.2 pg (ref 26.0–34.0)
MCHC: 33 g/dL (ref 30.0–36.0)
MCV: 88.6 fL (ref 80.0–100.0)
Monocytes Absolute: 0.5 10*3/uL (ref 0.1–1.0)
Monocytes Relative: 8 %
Neutro Abs: 4.3 10*3/uL (ref 1.7–7.7)
Neutrophils Relative %: 61 %
Platelets: 158 10*3/uL (ref 150–400)
RBC: 4.48 MIL/uL (ref 4.22–5.81)
RDW: 14.2 % (ref 11.5–15.5)
WBC: 7 10*3/uL (ref 4.0–10.5)
nRBC: 0 % (ref 0.0–0.2)

## 2021-05-08 LAB — RAPID URINE DRUG SCREEN, HOSP PERFORMED
Amphetamines: NOT DETECTED
Barbiturates: NOT DETECTED
Benzodiazepines: NOT DETECTED
Cocaine: NOT DETECTED
Opiates: NOT DETECTED
Tetrahydrocannabinol: NOT DETECTED

## 2021-05-08 LAB — PHOSPHORUS: Phosphorus: 3.3 mg/dL (ref 2.5–4.6)

## 2021-05-08 LAB — TSH: TSH: 3.474 u[IU]/mL (ref 0.350–4.500)

## 2021-05-08 LAB — GLUCOSE, CAPILLARY: Glucose-Capillary: 86 mg/dL (ref 70–99)

## 2021-05-08 LAB — MAGNESIUM: Magnesium: 1.9 mg/dL (ref 1.7–2.4)

## 2021-05-08 MED ORDER — MELATONIN 3 MG PO TABS
3.0000 mg | ORAL_TABLET | Freq: Every day | ORAL | Status: DC
  Administered 2021-05-08 – 2021-05-09 (×3): 3 mg via ORAL
  Filled 2021-05-08 (×3): qty 1

## 2021-05-08 MED ORDER — CARVEDILOL 12.5 MG PO TABS
12.5000 mg | ORAL_TABLET | Freq: Two times a day (BID) | ORAL | Status: DC
  Administered 2021-05-08 – 2021-05-09 (×2): 12.5 mg via ORAL
  Filled 2021-05-08 (×2): qty 1

## 2021-05-08 MED ORDER — POTASSIUM CHLORIDE CRYS ER 20 MEQ PO TBCR
40.0000 meq | EXTENDED_RELEASE_TABLET | Freq: Two times a day (BID) | ORAL | Status: AC
  Administered 2021-05-08 (×2): 40 meq via ORAL
  Filled 2021-05-08 (×2): qty 2

## 2021-05-08 MED ORDER — HYDRALAZINE HCL 25 MG PO TABS
25.0000 mg | ORAL_TABLET | Freq: Three times a day (TID) | ORAL | Status: DC
  Administered 2021-05-08: 09:00:00 25 mg via ORAL
  Filled 2021-05-08: qty 1

## 2021-05-08 MED ORDER — HYDRALAZINE HCL 25 MG PO TABS
25.0000 mg | ORAL_TABLET | Freq: Three times a day (TID) | ORAL | Status: DC
  Administered 2021-05-08 – 2021-05-09 (×3): 25 mg via ORAL
  Filled 2021-05-08 (×3): qty 1

## 2021-05-08 MED ORDER — SACUBITRIL-VALSARTAN 24-26 MG PO TABS
1.0000 | ORAL_TABLET | Freq: Two times a day (BID) | ORAL | Status: DC
  Administered 2021-05-09: 1 via ORAL
  Filled 2021-05-08 (×3): qty 1

## 2021-05-08 MED ORDER — HYDRALAZINE HCL 50 MG PO TABS
50.0000 mg | ORAL_TABLET | Freq: Three times a day (TID) | ORAL | Status: DC
Start: 2021-05-08 — End: 2021-05-08

## 2021-05-08 MED ORDER — LOSARTAN POTASSIUM 50 MG PO TABS: 100.0000 mg | ORAL_TABLET | Freq: Every day | ORAL | Status: DC

## 2021-05-08 MED ORDER — POTASSIUM CHLORIDE CRYS ER 20 MEQ PO TBCR
40.0000 meq | EXTENDED_RELEASE_TABLET | Freq: Once | ORAL | Status: AC
  Administered 2021-05-08: 09:00:00 40 meq via ORAL
  Filled 2021-05-08: qty 2

## 2021-05-08 MED ORDER — LOSARTAN POTASSIUM 50 MG PO TABS
50.0000 mg | ORAL_TABLET | Freq: Once | ORAL | Status: AC
  Administered 2021-05-08: 12:00:00 50 mg via ORAL
  Filled 2021-05-08: qty 1

## 2021-05-08 NOTE — Progress Notes (Addendum)
PROGRESS NOTE    Harold Bender  UJW:[id number] DOB: Jan 11, 1991 DOA: 05/06/2021 PCP: Patient, No Pcp Per (Inactive)   Brief Narrative:  Patient is a 30 year old African-American obese male with past medical history significant for but limited to hypertension noncompliance of medications as well as other comorbidities who presented with a cough.  He reports that the cough is recurrent.  For last 2 days it was unbearable and a chest and abdominal pain.  Also has not taken his blood pressure medication up to 3 years given that he is unable to afford.  He had a mild fever Monday briefly.  Last stated that he started having coughing the night before last.  And chest pain with coughing will radiate into his ribs.  He presented to urgent care and was given Cozaar.  He is then brought to the ED and had a cough and shortness of breath.  He had a pneumonia on chest x-ray and profoundly elevated blood pressure in the setting of cocaine.  He started on antibiotics and was given a dose of Cardizem and clonidine.  He was admitted for hypertensive urgency and uncontrolled hypertension and was found to have an elevated BNP.  Because he continued to have elevated blood pressures cardiology was consulted for further evaluation and have initiated the patient on a nitro glycerin drip.  Assessment & Plan:   Principal Problem:   Multifocal pneumonia Active Problems:   Uncontrolled hypertension   Elevated brain natriuretic peptide (BNP) level   Prolonged QT interval   Polysubstance abuse (HCC)  Multifocal PNA -Presented with a Cough, Subjective Fever, Tachypnea and Patchy Perihilar Infiltrates on CXR -Chest x-ray from 05/06/2021 showed "Cardiomegaly. Mediastinal shadows are normal. There is central bronchial thickening. There is patchy perihilar density right more than left consistent with mild pneumonia. No lobar consolidation or collapse. No effusion. Bony structures unremarkable."  -Appears to be most likely a CAP  but possibly viral -Influenza A and COVID-19 Negative -Ordering Gram Stain and Cx, GAS, and Lower Respiratory Tract PCT Level -CURB-65 is 1-2 and was admitted for further Evaluation and Treatment  -PSI was Class 2 -Started on Azithromycin and Ceftriaxone but have changed azithromycin to doxycycline given prolonged QT -C/w Antipyretics -Added Albuterol 2 puff IH q2h PRN and Guaifenesin 600 mg po BIDprn -Repeat CXR in the AM and will need an Ambulatory Home O2 Screen prior to D/C   Uncontrolled HTN/Hypertensive Urgency -Checking Troponin and initial was 46 and trended down to 35 -Known history of hypertension has not taken medication for several years and was on olmesartan previously but would not combine it due to lack of insurance and cost of medication -He now has insurance and is planning to start taking medications -Was given Cozaar at the urgent care yesterday morning and took his first dose -Increase his Cozaar dose to 50 mg p.o. daily but now cardiology is changing to Entresto 24/26 mg p.o. twice daily -He was given Cardizem and clonidine in the ED without significant improvement -He was added on as needed hydralazine as well as as needed labetalol -Cardiology's been consulted and his blood pressure was as high as 232/148 on admission -Initiated on amlodipine 10 mg p.o. daily and will continue cardiology will be adding carvedilol 12.5 mg p.o. twice daily -Cardiology is recommending starting the patient on IV nitro drip given his systolic pressures in the 200s and given his elevated BNP and symptoms of CHF and also started IV Lasix 40 mg daily as well as spironolactone 25 mg p.o.  daily; will continue on the nitro drip for now for blood pressure control and afterload reduction; see below -Cardiology feels that he has some untreated sleep apnea and recommending outpatient sleep study -Cardiology is also considering renal artery ultrasound and importance of cessation of tobacco and cocaine  use -Cardiology feels he will need outpatient ischemic evaluation given his cardiomyopathy  Suspected sleep apnea -Has admitted to snoring and wife has described apneic episode -The patient will need outpatient sleep study  Polysubstance Abuse -Patient admitted Daily marijuana use with heavy weekend ETOH/tobacco/cocaine use -Cessation encouraged; this should be encouraged on an ongoing basis; Patient admits to using Cocaine 2 weeks ago -UDS ordered  Tobacco Dependence -Encouraged cessation; this was discussed with the patient and should be reviewed on an ongoing basis.   -Patch ordered at patient request.  Prolonged QT -Likely assosciated with Infection from Multifocal PNA but could be related to drug use -Initial EKG showed a QTC of 618 ms -Avoid QT Prolonging medications including but not limited to PPI, Antiemetics, and SSRIs -Repeat EKG this AM has normalized to 470 on repeat -Correct electrolytes including potassium and magnesium; potassium was 3.4 yesterday and is now 4.1 today and magnesium is 2.0 -TSH was 3.474   Acute Combined Systolic and Diastolic CHF Suspected Hypertensive Cardiomyopathy and associated moderate pulm hypertension -No prior Baseline avilable but was 1,019.0 on Admisison -No overt signs of Volume overload but did have mild LE edema  -Given Cocaine Use, Prolonged QT, and Uncontrolled HTN, Echo has been ordered for further evaluation and Recommendations  -Cardiology consulted for further evaluation and management patient did endorse some CHF symptoms including dyspnea, orthopnea and PND as well as abdominal distention. -Cardiogram was done and showed an EF of 35 to 40% with global hypokinesis as well as grade 2 diastolic dysfunction -Cardiology feels that he will need outpatient ischemic evaluation and recommending continue nitro drip for blood pressure control and afterload reduction -Cardiology is continue diurese the patient with IV Lasix given his suspected  hypertensive cardiomyopathy -They are recommending continue amlodipine 10 mg p.o. daily and changing losartan to Entresto 24 to 26 mg twice daily -They are also going to start hydralazine 25 mg 3 times daily and uptitrate as necessary -We will continue spironolactone 12.5 mg p.o. daily and start carvedilol 12.5 mg p.o. twice daily and monitor response -Neuro signal consider and check for coverage for an SGLT2 inhibitor -They are recommending strict I's and O's and daily weights -Cardiology suspect some hypertensive cardiomyopathy and initiated the patient on IV Lasix 40 mg p.o. daily -They are recommending strict I's and O's and daily weights and continue to monitor his renal function carefully  Hypokalemia -Improved. K+ went from 3.4 -> 4.1 -> 3.1 -Treat with p.o. KCl 40 mg twice daily x2 doses and cardiology will give an extra dose today -Continue to Monitor and Trend -Repeat CMP in the AM  Thrombocytopenia -Platelet count went from 156 and trended down to 131 but is now trended back up to 158 -Continue to monitor and trend and continue to monitor for signs and symptoms of bleeding; currently no overt bleeding noted -Repeat CBC in a.m.  Hyperbilirubinemia -Elevated and Likely reactive -T Bili as 2.2 (Direct was 0.6 and Indirect was 1.6) and is now improved to 0 point -Continue to Monitor and Trend -Repeat CMP in the AM   Obesity -Complicates overall prognosis and care -Estimated body mass index is 36.93 kg/m as calculated from the following:   Height as of this encounter:  5\' 11"  (1.803 m).   Weight as of this encounter: 120.1 kg. -Weight Loss and Dietary Counseling given   DVT prophylaxis: Anticoagulated with Enoxaparin 40 mg sq q24h Code Status: FULL CODE  Family Communication: No family currently at bedside Disposition Plan: Pending further clinical improvement   Status is: Inpatient  Remains inpatient appropriate because: Patient continues to have uncontrolled high blood  pressure and is now on a nitro drip with cardiology following and diuresing.  Consultants:  Cardiology  Procedures:  ECHOCARDIOGRAM  IMPRESSIONS     1. Left ventricular ejection fraction, by estimation, is 35 to 40%. The  left ventricle has moderately decreased function. The left ventricle  demonstrates global hypokinesis. There is mild left ventricular  hypertrophy. Left ventricular diastolic  parameters are consistent with Grade II diastolic dysfunction  (pseudonormalization).   2. Right ventricular systolic function is normal. The right ventricular  size is normal. There is moderately elevated pulmonary artery systolic  pressure. The estimated right ventricular systolic pressure is 47.7 mmHg.   3. Left atrial size was mild to moderately dilated.   4. Right atrial size was mildly dilated.   5. A small pericardial effusion is present.   6. The mitral valve is normal in structure. Trivial mitral valve  regurgitation. No evidence of mitral stenosis.   7. The aortic valve is tricuspid. Aortic valve regurgitation is not  visualized. Aortic valve sclerosis/calcification is present, without any  evidence of aortic stenosis.   8. The inferior vena cava is normal in size with <50% respiratory  variability, suggesting right atrial pressure of 8 mmHg.   FINDINGS   Left Ventricle: Left ventricular ejection fraction, by estimation, is 35  to 40%. The left ventricle has moderately decreased function. The left  ventricle demonstrates global hypokinesis. Definity contrast agent was  given IV to delineate the left  ventricular endocardial borders. The left ventricular internal cavity size  was normal in size. There is mild left ventricular hypertrophy. Left  ventricular diastolic parameters are consistent with Grade II diastolic  dysfunction (pseudonormalization).   Right Ventricle: The right ventricular size is normal. No increase in  right ventricular wall thickness. Right ventricular  systolic function is  normal. There is moderately elevated pulmonary artery systolic pressure.  The tricuspid regurgitant velocity is  3.15 m/s, and with an assumed right atrial pressure of 8 mmHg, the  estimated right ventricular systolic pressure is 47.7 mmHg.   Left Atrium: Left atrial size was mild to moderately dilated.   Right Atrium: Right atrial size was mildly dilated.   Pericardium: A small pericardial effusion is present.   Mitral Valve: The mitral valve is normal in structure. Trivial mitral  valve regurgitation. No evidence of mitral valve stenosis.   Tricuspid Valve: The tricuspid valve is normal in structure. Tricuspid  valve regurgitation is trivial.   Aortic Valve: The aortic valve is tricuspid. Aortic valve regurgitation is  not visualized. Aortic valve sclerosis/calcification is present, without  any evidence of aortic stenosis.   Pulmonic Valve: The pulmonic valve was normal in structure. Pulmonic valve  regurgitation is not visualized.   Aorta: The aortic root is normal in size and structure.   Venous: The inferior vena cava is normal in size with less than 50%  respiratory variability, suggesting right atrial pressure of 8 mmHg.   IAS/Shunts: No atrial level shunt detected by color flow Doppler.      LEFT VENTRICLE  PLAX 2D  LVIDd:         5.70  cm      Diastology  LVIDs:         4.70 cm      LV e' medial:  8.92 cm/s  LV PW:         1.35 cm      LV e' lateral: 14.90 cm/s  LV IVS:        1.35 cm  LVOT diam:     2.10 cm  LV SV:         49  LV SV Index:   21  LVOT Area:     3.46 cm     LV Volumes (MOD)  LV vol d, MOD A2C: 178.0 ml  LV vol d, MOD A4C: 180.0 ml  LV vol s, MOD A2C: 108.0 ml  LV vol s, MOD A4C: 112.0 ml  LV SV MOD A2C:     70.0 ml  LV SV MOD A4C:     180.0 ml  LV SV MOD BP:      67.5 ml   RIGHT VENTRICLE             IVC  RV S prime:     11.50 cm/s  IVC diam: 2.00 cm  TAPSE (M-mode): 2.0 cm   LEFT ATRIUM              Index         RIGHT ATRIUM           Index  LA diam:        5.05 cm  2.13 cm/m   RA Area:     22.30 cm  LA Vol (A2C):   122.0 ml 51.36 ml/m  RA Volume:   63.80 ml  26.86 ml/m  LA Vol (A4C):   80.4 ml  33.84 ml/m  LA Biplane Vol: 103.0 ml 43.36 ml/m   AORTIC VALVE  LVOT Vmax:   96.70 cm/s  LVOT Vmean:  68.900 cm/s  LVOT VTI:    0.141 m     AORTA  Ao Root diam: 3.30 cm  Ao Asc diam:  3.30 cm   TRICUSPID VALVE  TR Peak grad:   39.7 mmHg  TR Vmax:        315.00 cm/s     SHUNTS  Systemic VTI:  0.14 m  Systemic Diam: 2.10 cm   Antimicrobials:  Anti-infectives (From admission, onward)    Start     Dose/Rate Route Frequency Ordered Stop   05/07/21 1500  doxycycline (VIBRAMYCIN) 100 mg in sodium chloride 0.9 % 250 mL IVPB        100 mg 125 mL/hr over 120 Minutes Intravenous Every 12 hours 05/07/21 1432     05/06/21 1500  cefTRIAXone (ROCEPHIN) 1 g in sodium chloride 0.9 % 100 mL IVPB        1 g 200 mL/hr over 30 Minutes Intravenous Every 24 hours 05/06/21 1445     05/06/21 1500  azithromycin (ZITHROMAX) 500 mg in sodium chloride 0.9 % 250 mL IVPB  Status:  Discontinued        500 mg 250 mL/hr over 60 Minutes Intravenous Every 24 hours 05/06/21 1445 05/07/21 1432        Subjective: In and examined at bedside and he still states that he is having some coughing.  Blood pressure still remains elevated.  States that he has no nausea or vomiting.  Has been urinating quite frequently.  No lightheadedness or dizziness.  No other concerns or complaints at this time.  Objective:  Vitals:   05/08/21 1030 05/08/21 1100 05/08/21 1130 05/08/21 1200  BP: (!) 177/105 (!) 159/80 (!) 170/83   Pulse: (!) 123 (!) 120 (!) 118 (!) 124  Resp: (!) 27 16 (!) 26 (!) 28  Temp:      TempSrc:      SpO2: 97% 97% 98% 95%  Weight:      Height:        Intake/Output Summary (Last 24 hours) at 05/08/2021 1211 Last data filed at 05/08/2021 1204 Gross per 24 hour  Intake 1233.17 ml  Output 2900 ml  Net -1666.83  ml    Filed Weights   05/06/21 1801  Weight: 120.1 kg   Examination: Physical Exam:  Constitutional: WN/WD obese African-American male currently in no acute distress appears calm and comfortable  Eyes: Lids and conjunctivae normal, sclerae anicteric  ENMT: External Ears, Nose appear normal. Grossly normal hearing. Mucous membranes are moist.  Neck: Appears normal, supple, no cervical masses, normal ROM, no appreciable thyromegaly: No appreciable JVD Respiratory: Slightly diminished to auscultation bilaterally, no wheezing, rales, rhonchi or crackles. Normal respiratory effort and patient is not tachypenic. No accessory muscle use.  Unlabored breathing Cardiovascular: Tachycardic rate but regular rhythm, no murmurs / rubs / gallops. S1 and S2 auscultated.  Trace lower extremity edema Abdomen: Soft, non-tender, distended secondary body habitus. Bowel sounds positive.  GU: Deferred. Musculoskeletal: No clubbing / cyanosis of digits/nails. Good ROM, no contractures. Normal strength and muscle tone.  Skin: No rashes, lesions, ulcers on limited skin evaluation. No induration; Warm and dry.  Neurologic: CN 2-12 grossly intact with no focal deficits. Romberg sign and cerebellar reflexes not assessed.  Psychiatric: Normal judgment and insight. Alert and oriented x 3. Normal mood and appropriate affect.   Data Reviewed: I have personally reviewed following labs and imaging studies  CBC: Recent Labs  Lab 05/06/21 1154 05/07/21 0313 05/08/21 0259  WBC 6.4 6.4 7.0  NEUTROABS 5.3  --  4.3  HGB 14.4 14.4 13.1  HCT 41.9 43.3 39.7  MCV 85.9 87.3 88.6  PLT 156 131* 158    Basic Metabolic Panel: Recent Labs  Lab 05/06/21 1154 05/07/21 0246 05/07/21 0313 05/08/21 0259  NA 137  --  139 138  K 3.4*  --  4.1 3.1*  CL 107  --  108 107  CO2 21*  --  22 22  GLUCOSE 127*  --  102* 107*  BUN 12  --  9 11  CREATININE 1.19  --  1.17 1.15  CALCIUM 8.7*  --  8.7* 8.6*  MG  --  2.0  --  1.9   PHOS  --  3.4  --  3.3    GFR: Estimated Creatinine Clearance: 121.6 mL/min (by C-G formula based on SCr of 1.15 mg/dL). Liver Function Tests: Recent Labs  Lab 05/07/21 0246 05/08/21 0259  AST 40 37  ALT 23 29  ALKPHOS 53 47  BILITOT 2.2* 0.9  PROT 6.7 6.2*  ALBUMIN 3.4* 3.1*   No results for input(s): LIPASE, AMYLASE in the last 168 hours. No results for input(s): AMMONIA in the last 168 hours. Coagulation Profile: No results for input(s): INR, PROTIME in the last 168 hours. Cardiac Enzymes: No results for input(s): CKTOTAL, CKMB, CKMBINDEX, TROPONINI in the last 168 hours. BNP (last 3 results) No results for input(s): PROBNP in the last 8760 hours. HbA1C: No results for input(s): HGBA1C in the last 72 hours. CBG: Recent Labs  Lab 05/08/21 0544  GLUCAP 86  Lipid Profile: No results for input(s): CHOL, HDL, LDLCALC, TRIG, CHOLHDL, LDLDIRECT in the last 72 hours. Thyroid Function Tests: Recent Labs    05/08/21 0259  TSH 3.474   Anemia Panel: No results for input(s): VITAMINB12, FOLATE, FERRITIN, TIBC, IRON, RETICCTPCT in the last 72 hours. Sepsis Labs: Recent Labs  Lab 05/06/21 1154 05/06/21 1320  PROCALCITON 0.14  --   LATICACIDVEN  --  1.2     Recent Results (from the past 240 hour(s))  Culture, blood (Routine X 2) w Reflex to ID Panel     Status: None (Preliminary result)   Collection Time: 05/06/21  1:19 PM   Specimen: BLOOD LEFT HAND  Result Value Ref Range Status   Specimen Description   Final    BLOOD LEFT HAND Performed at [facility], [address], [place], [place] [address]    Special Requests   Final    BOTTLES DRAWN AEROBIC AND ANAEROBIC Blood Culture results may not be optimal due to an inadequate volume of blood received in culture bottles Performed at [facility], [address] [place], [place] [address]    Culture   Final    NO GROWTH < 12 HOURS Performed at [facility], [address] [place], [place] [address]    Report Status PENDING  Incomplete  Culture, blood (Routine X 2) w Reflex to ID Panel     Status: None (Preliminary result)   Collection Time: 05/06/21  1:20 PM   Specimen: BLOOD  Result Value Ref Range Status   Specimen Description   Final    BLOOD LEFT ANTECUBITAL Performed at [facility], [address], [place], [place] [address]    Special Requests   Final    BOTTLES DRAWN AEROBIC AND ANAEROBIC Blood Culture adequate volume Performed at [facility], [address], [place], [place] [address]    Culture   Final    NO GROWTH < 12 HOURS Performed at [facility], [address], [place], [place] [address]    Report Status PENDING  Incomplete  Resp Panel by RT-PCR (Flu A&B, Covid) Nasopharyngeal Swab     Status: None   Collection Time: 05/06/21  1:20 PM   Specimen: Nasopharyngeal Swab; Nasopharyngeal(NP) swabs in vial transport medium  Result Value Ref Range Status   SARS Coronavirus 2 by RT PCR NEGATIVE NEGATIVE Final    Comment: (NOTE) SARS-CoV-2 target nucleic acids are NOT DETECTED.  The SARS-CoV-2 RNA is generally detectable in upper respiratory specimens during the acute phase of infection. The lowest concentration of SARS-CoV-2 viral copies this assay can detect is 138 copies/mL. A negative result does not preclude SARS-Cov-2 infection and should not be used as the sole basis for treatment or other patient management decisions. A negative result may occur with  improper specimen collection/handling, submission of specimen other than nasopharyngeal swab, presence of viral mutation(s) within the areas targeted by this assay, and inadequate number of viral copies(<138 copies/mL). A negative result must be combined with clinical observations, patient history, and epidemiological information. The expected result is Negative.  Fact Sheet for Patients:   BloggerCourse.com  Fact Sheet for Healthcare Providers:  SeriousBroker.it  This test is no t yet approved or cleared by the [place] FDA and  has been authorized for detection and/or diagnosis of SARS-CoV-2 by FDA under an Emergency Use Authorization (EUA). This EUA will remain  in effect (meaning this test can be used) for  the duration of the COVID-19 declaration under Section 564(b)(1) of the Act, 21 U.S.C.section 360bbb-3(b)(1), unless the authorization is terminated  or revoked sooner.       Influenza A by PCR NEGATIVE NEGATIVE Final   Influenza B by PCR NEGATIVE NEGATIVE Final    Comment: (NOTE) The Xpert Xpress SARS-CoV-2/FLU/RSV plus assay is intended as an aid in the diagnosis of influenza from Nasopharyngeal swab specimens and should not be used as a sole basis for treatment. Nasal washings and aspirates are unacceptable for Xpert Xpress SARS-CoV-2/FLU/RSV testing.  Fact Sheet for Patients: BloggerCourse.com  Fact Sheet for Healthcare Providers: SeriousBroker.it  This test is not yet approved or cleared by the [place] FDA and has been authorized for detection and/or diagnosis of SARS-CoV-2 by FDA under an Emergency Use Authorization (EUA). This EUA will remain in effect (meaning this test can be used) for the duration of the COVID-19 declaration under Section 564(b)(1) of the Act, 21 U.S.C. section 360bbb-3(b)(1), unless the authorization is terminated or revoked.  Performed at [facility], [address] [place], [place] [address]   MRSA Next Gen by PCR, Nasal     Status: None   Collection Time: 05/06/21  6:03 PM   Specimen: Nasal Mucosa; Nasal Swab  Result Value Ref Range Status   MRSA by PCR Next Gen NOT DETECTED NOT DETECTED Final    Comment: (NOTE) The GeneXpert MRSA Assay (FDA approved for NASAL specimens only), is one  component of a comprehensive MRSA colonization surveillance program. It is not intended to diagnose MRSA infection nor to guide or monitor treatment for MRSA infections. Test performance is not FDA approved in patients less than 82 years old. Performed at [facility], [address] [place], [place] [address]     RN Pressure Injury Documentation:     Estimated body mass index is 36.93 kg/m as calculated from the following:   Height as of this encounter: 5\' 11"  (1.803 m).   Weight as of this encounter: 120.1 kg.  Malnutrition Type:   Malnutrition Characteristics:   Nutrition Interventions:   Radiology Studies: ECHOCARDIOGRAM COMPLETE  Result Date: 05/07/2021    ECHOCARDIOGRAM REPORT   Patient Name:   KC MANCILLA Date of Exam: 05/07/2021 Medical Rec #:  [id number]    Height:       71.0 in Accession #:    [id number]   Weight:       264.8 lb Date of Birth:  March 17, 1991    BSA:          2.376 m Patient Age:    30 years     BP:           186/131 mmHg Patient Gender: M            HR:           110 bpm. Exam Location:  Inpatient Procedure: 2D Echo, Cardiac Doppler, Color Doppler and Intracardiac            Opacification Agent Indications:    HTN/SOB  History:        Patient has no prior history of Echocardiogram examinations.                 Risk Factors:Hypertension.  Sonographer:    Alvera Novel Referring Phys: [id number] JENNIFER YATES  Sonographer Comments: Patient is morbidly obese. Image acquisition challenging due to patient body habitus. IMPRESSIONS  1. Left ventricular ejection fraction, by estimation, is 35 to 40%. The left ventricle  has moderately decreased function. The left ventricle demonstrates global hypokinesis. There is mild left ventricular hypertrophy. Left ventricular diastolic parameters are consistent with Grade II diastolic dysfunction (pseudonormalization).  2. Right ventricular systolic function is normal. The right ventricular size is normal. There is  moderately elevated pulmonary artery systolic pressure. The estimated right ventricular systolic pressure is 47.7 mmHg.  3. Left atrial size was mild to moderately dilated.  4. Right atrial size was mildly dilated.  5. A small pericardial effusion is present.  6. The mitral valve is normal in structure. Trivial mitral valve regurgitation. No evidence of mitral stenosis.  7. The aortic valve is tricuspid. Aortic valve regurgitation is not visualized. Aortic valve sclerosis/calcification is present, without any evidence of aortic stenosis.  8. The inferior vena cava is normal in size with <50% respiratory variability, suggesting right atrial pressure of 8 mmHg. FINDINGS  Left Ventricle: Left ventricular ejection fraction, by estimation, is 35 to 40%. The left ventricle has moderately decreased function. The left ventricle demonstrates global hypokinesis. Definity contrast agent was given IV to delineate the left ventricular endocardial borders. The left ventricular internal cavity size was normal in size. There is mild left ventricular hypertrophy. Left ventricular diastolic parameters are consistent with Grade II diastolic dysfunction (pseudonormalization). Right Ventricle: The right ventricular size is normal. No increase in right ventricular wall thickness. Right ventricular systolic function is normal. There is moderately elevated pulmonary artery systolic pressure. The tricuspid regurgitant velocity is 3.15 m/s, and with an assumed right atrial pressure of 8 mmHg, the estimated right ventricular systolic pressure is 47.7 mmHg. Left Atrium: Left atrial size was mild to moderately dilated. Right Atrium: Right atrial size was mildly dilated. Pericardium: A small pericardial effusion is present. Mitral Valve: The mitral valve is normal in structure. Trivial mitral valve regurgitation. No evidence of mitral valve stenosis. Tricuspid Valve: The tricuspid valve is normal in structure. Tricuspid valve regurgitation is  trivial. Aortic Valve: The aortic valve is tricuspid. Aortic valve regurgitation is not visualized. Aortic valve sclerosis/calcification is present, without any evidence of aortic stenosis. Pulmonic Valve: The pulmonic valve was normal in structure. Pulmonic valve regurgitation is not visualized. Aorta: The aortic root is normal in size and structure. Venous: The inferior vena cava is normal in size with less than 50% respiratory variability, suggesting right atrial pressure of 8 mmHg. IAS/Shunts: No atrial level shunt detected by color flow Doppler.  LEFT VENTRICLE PLAX 2D LVIDd:         5.70 cm      Diastology LVIDs:         4.70 cm      LV e' medial:  8.92 cm/s LV PW:         1.35 cm      LV e' lateral: 14.90 cm/s LV IVS:        1.35 cm LVOT diam:     2.10 cm LV SV:         49 LV SV Index:   21 LVOT Area:     3.46 cm  LV Volumes (MOD) LV vol d, MOD A2C: 178.0 ml LV vol d, MOD A4C: 180.0 ml LV vol s, MOD A2C: 108.0 ml LV vol s, MOD A4C: 112.0 ml LV SV MOD A2C:     70.0 ml LV SV MOD A4C:     180.0 ml LV SV MOD BP:      67.5 ml RIGHT VENTRICLE             IVC  RV S prime:     11.50 cm/s  IVC diam: 2.00 cm TAPSE (M-mode): 2.0 cm LEFT ATRIUM              Index        RIGHT ATRIUM           Index LA diam:        5.05 cm  2.13 cm/m   RA Area:     22.30 cm LA Vol (A2C):   122.0 ml 51.36 ml/m  RA Volume:   63.80 ml  26.86 ml/m LA Vol (A4C):   80.4 ml  33.84 ml/m LA Biplane Vol: 103.0 ml 43.36 ml/m  AORTIC VALVE LVOT Vmax:   96.70 cm/s LVOT Vmean:  68.900 cm/s LVOT VTI:    0.141 m  AORTA Ao Root diam: 3.30 cm Ao Asc diam:  3.30 cm TRICUSPID VALVE TR Peak grad:   39.7 mmHg TR Vmax:        315.00 cm/s  SHUNTS Systemic VTI:  0.14 m Systemic Diam: 2.10 cm Dalton McleanMD Electronically signed by Wilfred Lacy Signature Date/Time: 05/07/2021/5:17:04 PM    Final     Scheduled Meds:  amLODipine  10 mg Oral Daily   carvedilol  12.5 mg Oral BID WC   Chlorhexidine Gluconate Cloth  6 each Topical Daily   docusate sodium   100 mg Oral BID   enoxaparin (LOVENOX) injection  60 mg Subcutaneous Q24H   furosemide  40 mg Intravenous Daily   hydrALAZINE  25 mg Oral TID   melatonin  3 mg Oral QHS   nicotine  7 mg Transdermal Daily   potassium chloride  40 mEq Oral BID   [START ON 05/09/2021] sacubitril-valsartan  1 tablet Oral BID   sodium chloride flush  3 mL Intravenous Q12H   spironolactone  25 mg Oral Daily   Continuous Infusions:  cefTRIAXone (ROCEPHIN)  IV Stopped (05/07/21 2201)   doxycycline (VIBRAMYCIN) IV Stopped (05/08/21 0530)   nitroGLYCERIN 200 mcg/min (05/08/21 1001)    LOS: 2 days   Merlene Laughter, DO [facility] PAGER is on AMION  If 7PM-7AM, please contact night-coverage [email or web address]

## 2021-05-08 NOTE — Progress Notes (Addendum)
Progress Note  Patient Name: Harold Bender Date of Encounter: 05/08/2021  [facility] Cardiologist: Meriam Sprague, MD new  Subjective   No CP, denies SOB Says this was a wake--up call for him Just got married, wants to focus on healthy lifestyle  Inpatient Medications    Scheduled Meds:  amLODipine  10 mg Oral Daily   Chlorhexidine Gluconate Cloth  6 each Topical Daily   docusate sodium  100 mg Oral BID   enoxaparin (LOVENOX) injection  60 mg Subcutaneous Q24H   furosemide  40 mg Intravenous Daily   losartan  50 mg Oral Daily   melatonin  3 mg Oral QHS   nicotine  7 mg Transdermal Daily   sodium chloride flush  3 mL Intravenous Q12H   spironolactone  25 mg Oral Daily   Continuous Infusions:  cefTRIAXone (ROCEPHIN)  IV Stopped (05/07/21 2201)   doxycycline (VIBRAMYCIN) IV Stopped (05/08/21 0530)   nitroGLYCERIN 200 mcg/min (05/08/21 0655)   PRN Meds: acetaminophen **OR** acetaminophen, albuterol, bisacodyl, guaiFENesin, hydrALAZINE, hydrOXYzine, labetalol, morphine injection, oxyCODONE, polyethylene glycol   Vital Signs    Vitals:   05/07/21 2300 05/08/21 0000 05/08/21 0400 05/08/21 0700  BP: (!) 172/90   (!) 183/107  Pulse: (!) 107   (!) 115  Resp: 20   (!) 23  Temp:  98.6 F (37 C) 98.5 F (36.9 C)   TempSrc:  Oral Oral   SpO2: 96%   93%  Weight:      Height:        Intake/Output Summary (Last 24 hours) at 05/08/2021 0711 Last data filed at 05/08/2021 7897 Gross per 24 hour  Intake 1223.17 ml  Output 400 ml  Net 823.17 ml   Last 3 Weights 05/06/2021  Weight (lbs) 264 lb 12.4 oz  Weight (kg) 120.1 kg      Telemetry    ST - Personally Reviewed  ECG    None today - Personally Reviewed  Physical Exam   GEN: No acute distress.   Neck: JVD 9 CM Cardiac: RRR, no murmurs, rubs, or gallops.  Respiratory: few rales bases bilaterally. GI: Soft, nontender, non-distended  MS: No edema; No deformity. Neuro:  Nonfocal  Psych: Normal affect    Labs    High Sensitivity Troponin:   Recent Labs  Lab 05/07/21 0935 05/07/21 1424  TROPONINIHS 46* 35*     Chemistry Recent Labs  Lab 05/06/21 1154 05/07/21 0246 05/07/21 0313 05/08/21 0259  NA 137  --  139 138  K 3.4*  --  4.1 3.1*  CL 107  --  108 107  CO2 21*  --  22 22  GLUCOSE 127*  --  102* 107*  BUN 12  --  9 11  CREATININE 1.19  --  1.17 1.15  CALCIUM 8.7*  --  8.7* 8.6*  MG  --  2.0  --  1.9  PROT  --  6.7  --  6.2*  ALBUMIN  --  3.4*  --  3.1*  AST  --  40  --  37  ALT  --  23  --  29  ALKPHOS  --  53  --  47  BILITOT  --  2.2*  --  0.9  GFRNONAA >60  --  >60 >60  ANIONGAP 9  --  9 9    Lipids No results for input(s): CHOL, TRIG, HDL, LABVLDL, LDLCALC, CHOLHDL in the last 168 hours.  Hematology Recent Labs  Lab 05/06/21 1154 05/07/21  5329 05/08/21 0259  WBC 6.4 6.4 7.0  RBC 4.88 4.96 4.48  HGB 14.4 14.4 13.1  HCT 41.9 43.3 39.7  MCV 85.9 87.3 88.6  MCH 29.5 29.0 29.2  MCHC 34.4 33.3 33.0  RDW 14.3 14.3 14.2  PLT 156 131* 158   Thyroid  Recent Labs  Lab 05/08/21 0259  TSH 3.474    BNP Recent Labs  Lab 05/06/21 1154  BNP 1,019.0*    Drugs of Abuse  No results found for: LABOPIA, COCAINSCRNUR, LABBENZ, AMPHETMU, THCU, LABBARB   Lab Results  Component Value Date   TSH 3.474 05/08/2021   No results found for: HGBA1C  DDimer No results for input(s): DDIMER in the last 168 hours.   Radiology    DG Chest 2 View  Result Date: 05/06/2021 CLINICAL DATA:  Shortness of breath, cough and chest tightness EXAM: CHEST - 2 VIEW COMPARISON:  01/28/2016 FINDINGS: Cardiomegaly. Mediastinal shadows are normal. There is central bronchial thickening. There is patchy perihilar density right more than left consistent with mild pneumonia. No lobar consolidation or collapse. No effusion. Bony structures unremarkable. IMPRESSION: Bronchitis.  Mild patchy perihilar pneumonia. Electronically Signed   By: Paulina Fusi M.D.   On: 05/06/2021 12:10    ECHOCARDIOGRAM COMPLETE  Result Date: 05/07/2021    ECHOCARDIOGRAM REPORT   Patient Name:   Harold Bender Date of Exam: 05/07/2021 Medical Rec #:  [id number]    Height:       71.0 in Accession #:    [id number]   Weight:       264.8 lb Date of Birth:  10-02-1990    BSA:          2.376 m Patient Age:    30 years     BP:           186/131 mmHg Patient Gender: M            HR:           110 bpm. Exam Location:  Inpatient Procedure: 2D Echo, Cardiac Doppler, Color Doppler and Intracardiac            Opacification Agent Indications:    HTN/SOB  History:        Patient has no prior history of Echocardiogram examinations.                 Risk Factors:Hypertension.  Sonographer:    Alvera Novel Referring Phys: [id number] JENNIFER YATES  Sonographer Comments: Patient is morbidly obese. Image acquisition challenging due to patient body habitus. IMPRESSIONS  1. Left ventricular ejection fraction, by estimation, is 35 to 40%. The left ventricle has moderately decreased function. The left ventricle demonstrates global hypokinesis. There is mild left ventricular hypertrophy. Left ventricular diastolic parameters are consistent with Grade II diastolic dysfunction (pseudonormalization).  2. Right ventricular systolic function is normal. The right ventricular size is normal. There is moderately elevated pulmonary artery systolic pressure. The estimated right ventricular systolic pressure is 47.7 mmHg.  3. Left atrial size was mild to moderately dilated.  4. Right atrial size was mildly dilated.  5. A small pericardial effusion is present.  6. The mitral valve is normal in structure. Trivial mitral valve regurgitation. No evidence of mitral stenosis.  7. The aortic valve is tricuspid. Aortic valve regurgitation is not visualized. Aortic valve sclerosis/calcification is present, without any evidence of aortic stenosis.  8. The inferior vena cava is normal in size with <50% respiratory variability, suggesting right atrial pressure of  8 mmHg. FINDINGS  Left Ventricle: Left ventricular ejection fraction, by estimation, is 35 to 40%. The left ventricle has moderately decreased function. The left ventricle demonstrates global hypokinesis. Definity contrast agent was given IV to delineate the left ventricular endocardial borders. The left ventricular internal cavity size was normal in size. There is mild left ventricular hypertrophy. Left ventricular diastolic parameters are consistent with Grade II diastolic dysfunction (pseudonormalization). Right Ventricle: The right ventricular size is normal. No increase in right ventricular wall thickness. Right ventricular systolic function is normal. There is moderately elevated pulmonary artery systolic pressure. The tricuspid regurgitant velocity is 3.15 m/s, and with an assumed right atrial pressure of 8 mmHg, the estimated right ventricular systolic pressure is 47.7 mmHg. Left Atrium: Left atrial size was mild to moderately dilated. Right Atrium: Right atrial size was mildly dilated. Pericardium: A small pericardial effusion is present. Mitral Valve: The mitral valve is normal in structure. Trivial mitral valve regurgitation. No evidence of mitral valve stenosis. Tricuspid Valve: The tricuspid valve is normal in structure. Tricuspid valve regurgitation is trivial. Aortic Valve: The aortic valve is tricuspid. Aortic valve regurgitation is not visualized. Aortic valve sclerosis/calcification is present, without any evidence of aortic stenosis. Pulmonic Valve: The pulmonic valve was normal in structure. Pulmonic valve regurgitation is not visualized. Aorta: The aortic root is normal in size and structure. Venous: The inferior vena cava is normal in size with less than 50% respiratory variability, suggesting right atrial pressure of 8 mmHg. IAS/Shunts: No atrial level shunt detected by color flow Doppler.  LEFT VENTRICLE PLAX 2D LVIDd:         5.70 cm      Diastology LVIDs:         4.70 cm      LV e' medial:   8.92 cm/s LV PW:         1.35 cm      LV e' lateral: 14.90 cm/s LV IVS:        1.35 cm LVOT diam:     2.10 cm LV SV:         49 LV SV Index:   21 LVOT Area:     3.46 cm  LV Volumes (MOD) LV vol d, MOD A2C: 178.0 ml LV vol d, MOD A4C: 180.0 ml LV vol s, MOD A2C: 108.0 ml LV vol s, MOD A4C: 112.0 ml LV SV MOD A2C:     70.0 ml LV SV MOD A4C:     180.0 ml LV SV MOD BP:      67.5 ml RIGHT VENTRICLE             IVC RV S prime:     11.50 cm/s  IVC diam: 2.00 cm TAPSE (M-mode): 2.0 cm LEFT ATRIUM              Index        RIGHT ATRIUM           Index LA diam:        5.05 cm  2.13 cm/m   RA Area:     22.30 cm LA Vol (A2C):   122.0 ml 51.36 ml/m  RA Volume:   63.80 ml  26.86 ml/m LA Vol (A4C):   80.4 ml  33.84 ml/m LA Biplane Vol: 103.0 ml 43.36 ml/m  AORTIC VALVE LVOT Vmax:   96.70 cm/s LVOT Vmean:  68.900 cm/s LVOT VTI:    0.141 m  AORTA Ao Root diam: 3.30 cm Ao Asc diam:  3.30 cm TRICUSPID VALVE TR  Peak grad:   39.7 mmHg TR Vmax:        315.00 cm/s  SHUNTS Systemic VTI:  0.14 m Systemic Diam: 2.10 cm Dalton McleanMD Electronically signed by Wilfred Lacy Signature Date/Time: 05/07/2021/5:17:04 PM    Final     Cardiac Studies   ECHO: 05/07/2021  1. Left ventricular ejection fraction, by estimation, is 35 to 40%. The  left ventricle has moderately decreased function. The left ventricle  demonstrates global hypokinesis. There is mild left ventricular  hypertrophy. Left ventricular diastolic parameters are consistent with Grade II diastolic dysfunction (pseudonormalization).   2. Right ventricular systolic function is normal. The right ventricular  size is normal. There is moderately elevated pulmonary artery systolic pressure. The estimated right ventricular systolic pressure is 47.7 mmHg.   3. Left atrial size was mild to moderately dilated.   4. Right atrial size was mildly dilated.   5. A small pericardial effusion is present.   6. The mitral valve is normal in structure. Trivial mitral valve   regurgitation. No evidence of mitral stenosis.   7. The aortic valve is tricuspid. Aortic valve regurgitation is not  visualized. Aortic valve sclerosis/calcification is present, without any  evidence of aortic stenosis.   8. The inferior vena cava is normal in size with <50% respiratory  variability, suggesting right atrial pressure of 8 mmHg.   Patient Profile     29 y.o. male w/ hx HTN, polysubs abuse was admitted 11/15 with cough, SOB, abd tightness, HTN emergency. Cards saw for HTN, CHF, elevated troponin.  Assessment & Plan    HTN Emergency - SBP peak 232/148 on admit - off all meds x 2 years 2nd cost - on amlodipine 10 mg qd, losartan 50 mg qd, spiro 25 mg qd and IV Lasix - BP still elevated, will add hydralazine 25 mg tid - discuss adding Coreg w/ MD - w/ decreased EF, discuss if amlodipine is best rx for him  2. Acute combined CHF - EF 35-40% w/ grade 2 dd, no WMA, RV ok on echo, RVSP 47.7 - On Lasix 40 mg IV qd, discuss increasing to bid although no extreme volume overload on exam - K+ 4.2 >> 3.1 after 1 dose Lasix 40 mg IV - supp w/ 40 meq bid ordered, will give an extra dose this am  3. Hx polysubs abuse - says last cocaine 2 weeks ago - smokes THC - willing to do UDS, will order - willing to quit  For questions or updates, please contact [facility] Please consult [email or web address] for contact info under        Signed, Theodore Demark, PA-C  05/08/2021, 7:11 AM    Patient seen and examined and agree with Theodore Demark, PA-C as detailed above.    In brief, the patient is a 30 year old male with history of HTN not currently on medications and polysubstance abuse who presented to the ER with chest pain and cough found to be hypertensive with elevated BNP for which Cardiology has been consulted.    Patient states that he developed worsening cough, orthopnea and PND over the past several weeks. Notably, has uncontrolled HTN and has not been on medications for  several years due to inability to afford them. Also, has been using cocaine with last use 2 weeks ago. States his chest pain only developed and occurs with coughing. No exertional symptoms. TTE here with LVEF 35-40% with global hypokinesis, mild LVH, G2DD, moderate PHTN. Suspect HTN induced cardiomyopathy and will optimize  medications. Plan for ischemic evaluation as out-patient.    GEN: No acute distress.   Neck: JVD to mid neck Cardiac: RRR, no murmurs, rubs, or gallops.  Respiratory: Faint crackles at left lung base GI: Soft, nontender, non-distended  MS: Trace LE edema, warm Neuro:  Nonfocal  Psych: Normal affect     Plan: -TTE with depressed EF (35-40%) with global hypokinesis likely secondary to hypertension induced CM -Will need ischemic evaluation as out-patient -Continue nitro gtt for blood pressure control and afterload reduction -Continue lasix 40mg  IV daily; responding well -Continue amlodipine 10mg  -Change losartan to entresto 24/26mg  BID; will check about coverage with pharmacy -Started hydralazine 25mg  TID; uptitrate as needed -Continue spiro 25mg  daily -Start coreg 12.5mg  BID and monitor response -Will check about coverage for SGLT2i -Discussed importance of abstaining from cocaine, alcohol -Monitor I/Os and daily weights -Low Na diet -Agree with outpatient sleep study  , MD

## 2021-05-09 ENCOUNTER — Inpatient Hospital Stay (HOSPITAL_COMMUNITY): Payer: BC Managed Care – PPO

## 2021-05-09 LAB — CBC WITH DIFFERENTIAL/PLATELET
Abs Immature Granulocytes: 0.02 10*3/uL (ref 0.00–0.07)
Basophils Absolute: 0 10*3/uL (ref 0.0–0.1)
Basophils Relative: 0 %
Eosinophils Absolute: 0.2 10*3/uL (ref 0.0–0.5)
Eosinophils Relative: 2 %
HCT: 39.9 % (ref 39.0–52.0)
Hemoglobin: 13 g/dL (ref 13.0–17.0)
Immature Granulocytes: 0 %
Lymphocytes Relative: 34 %
Lymphs Abs: 2.4 10*3/uL (ref 0.7–4.0)
MCH: 28.6 pg (ref 26.0–34.0)
MCHC: 32.6 g/dL (ref 30.0–36.0)
MCV: 87.9 fL (ref 80.0–100.0)
Monocytes Absolute: 0.8 10*3/uL (ref 0.1–1.0)
Monocytes Relative: 11 %
Neutro Abs: 3.7 10*3/uL (ref 1.7–7.7)
Neutrophils Relative %: 53 %
Platelets: 203 10*3/uL (ref 150–400)
RBC: 4.54 MIL/uL (ref 4.22–5.81)
RDW: 13.9 % (ref 11.5–15.5)
WBC: 7 10*3/uL (ref 4.0–10.5)
nRBC: 0 % (ref 0.0–0.2)

## 2021-05-09 LAB — COMPREHENSIVE METABOLIC PANEL
ALT: 24 U/L (ref 0–44)
AST: 22 U/L (ref 15–41)
Albumin: 3.3 g/dL — ABNORMAL LOW (ref 3.5–5.0)
Alkaline Phosphatase: 45 U/L (ref 38–126)
Anion gap: 8 (ref 5–15)
BUN: 8 mg/dL (ref 6–20)
CO2: 21 mmol/L — ABNORMAL LOW (ref 22–32)
Calcium: 8.4 mg/dL — ABNORMAL LOW (ref 8.9–10.3)
Chloride: 108 mmol/L (ref 98–111)
Creatinine, Ser: 1.09 mg/dL (ref 0.61–1.24)
GFR, Estimated: >60 mL/min (ref >60)
Glucose, Bld: 106 mg/dL — ABNORMAL HIGH (ref 70–99)
Potassium: 3.6 mmol/L (ref 3.5–5.1)
Sodium: 137 mmol/L (ref 135–145)
Total Bilirubin: 0.8 mg/dL (ref 0.3–1.2)
Total Protein: 6.2 g/dL — ABNORMAL LOW (ref 6.5–8.1)

## 2021-05-09 LAB — PHOSPHORUS: Phosphorus: 3.2 mg/dL (ref 2.5–4.6)

## 2021-05-09 LAB — MAGNESIUM: Magnesium: 1.7 mg/dL (ref 1.7–2.4)

## 2021-05-09 MED ORDER — SODIUM CHLORIDE 0.9% FLUSH
10.0000 mL | Freq: Two times a day (BID) | INTRAVENOUS | Status: DC
  Administered 2021-05-09 – 2021-05-10 (×3): 10 mL

## 2021-05-09 MED ORDER — CARVEDILOL 12.5 MG PO TABS
25.0000 mg | ORAL_TABLET | Freq: Two times a day (BID) | ORAL | Status: DC
  Administered 2021-05-09 – 2021-05-10 (×2): 25 mg via ORAL
  Filled 2021-05-09 (×2): qty 2

## 2021-05-09 MED ORDER — MAGNESIUM SULFATE 2 GM/50ML IV SOLN
2.0000 g | Freq: Once | INTRAVENOUS | Status: AC
  Administered 2021-05-09: 2 g via INTRAVENOUS
  Filled 2021-05-09: qty 50

## 2021-05-09 MED ORDER — DAPAGLIFLOZIN PROPANEDIOL 10 MG PO TABS
10.0000 mg | ORAL_TABLET | Freq: Every day | ORAL | Status: DC
  Administered 2021-05-09 – 2021-05-10 (×2): 10 mg via ORAL
  Filled 2021-05-09 (×2): qty 1

## 2021-05-09 MED ORDER — HYDRALAZINE HCL 50 MG PO TABS
50.0000 mg | ORAL_TABLET | Freq: Three times a day (TID) | ORAL | Status: DC
  Administered 2021-05-09 – 2021-05-10 (×3): 50 mg via ORAL
  Filled 2021-05-09 (×3): qty 1

## 2021-05-09 MED ORDER — SODIUM CHLORIDE 0.9% FLUSH: 10.0000 mL | INTRAVENOUS | Status: DC | PRN

## 2021-05-09 MED ORDER — SACUBITRIL-VALSARTAN 49-51 MG PO TABS
1.0000 | ORAL_TABLET | Freq: Two times a day (BID) | ORAL | Status: DC
  Administered 2021-05-09 – 2021-05-10 (×2): 1 via ORAL
  Filled 2021-05-09 (×3): qty 1

## 2021-05-09 NOTE — Plan of Care (Signed)

## 2021-05-09 NOTE — Progress Notes (Signed)
Progress Note  Patient Name: Harold Bender Date of Encounter: 05/09/2021  [facility] Cardiologist: Meriam Sprague, MD new  Subjective   Feels better this morning. Continues to have mild orthopnea. No chest pain or SOB. BP is slowly improving  Inpatient Medications    Scheduled Meds:  amLODipine  10 mg Oral Daily   carvedilol  25 mg Oral BID WC   Chlorhexidine Gluconate Cloth  6 each Topical Daily   dapagliflozin propanediol  10 mg Oral Daily   docusate sodium  100 mg Oral BID   enoxaparin (LOVENOX) injection  60 mg Subcutaneous Q24H   furosemide  40 mg Intravenous Daily   hydrALAZINE  50 mg Oral TID   melatonin  3 mg Oral QHS   nicotine  7 mg Transdermal Daily   sacubitril-valsartan  1 tablet Oral BID   sodium chloride flush  10-40 mL Intracatheter Q12H   sodium chloride flush  3 mL Intravenous Q12H   spironolactone  25 mg Oral Daily   Continuous Infusions:  cefTRIAXone (ROCEPHIN)  IV Stopped (05/08/21 1725)   doxycycline (VIBRAMYCIN) IV Stopped (05/09/21 0653)   PRN Meds: acetaminophen **OR** acetaminophen, albuterol, bisacodyl, guaiFENesin, hydrALAZINE, hydrOXYzine, labetalol, morphine injection, oxyCODONE, polyethylene glycol, sodium chloride flush   Vital Signs    Vitals:   05/09/21 1100 05/09/21 1200 05/09/21 1219 05/09/21 1300  BP: (!) 164/100 (!) 151/86  (!) 142/70  Pulse: 100 (!) 102  (!) 101  Resp: (!) 30 (!) 24  (!) 47  Temp:   98.2 F (36.8 C)   TempSrc:   Oral   SpO2: 98% 96%  91%  Weight:      Height:        Intake/Output Summary (Last 24 hours) at 05/09/2021 1438 Last data filed at 05/09/2021 1200 Gross per 24 hour  Intake 2530.66 ml  Output 2425 ml  Net 105.66 ml   Last 3 Weights 05/06/2021  Weight (lbs) 264 lb 12.4 oz  Weight (kg) 120.1 kg      Telemetry    Sinus tachycardia - Personally Reviewed  ECG    No new tracing - Personally Reviewed  Physical Exam   GEN: Comfortable, sitting in bed Neck: JVD mildly  elevated Cardiac: RRR, no murmurs, rubs, or gallops.  Respiratory: CTAB GI: Soft, nontender, non-distended  MS: Warm, trace edema Neuro:  Nonfocal  Psych: Normal affect   Labs    High Sensitivity Troponin:   Recent Labs  Lab 05/07/21 0935 05/07/21 1424  TROPONINIHS 46* 35*     Chemistry Recent Labs  Lab 05/07/21 0246 05/07/21 0313 05/08/21 0259 05/09/21 0252  NA  --  139 138 137  K  --  4.1 3.1* 3.6  CL  --  108 107 108  CO2  --  22 22 21*  GLUCOSE  --  102* 107* 106*  BUN  --  9 11 8   CREATININE  --  1.17 1.15 1.09  CALCIUM  --  8.7* 8.6* 8.4*  MG 2.0  --  1.9 1.7  PROT 6.7  --  6.2* 6.2*  ALBUMIN 3.4*  --  3.1* 3.3*  AST 40  --  37 22  ALT 23  --  29 24  ALKPHOS 53  --  47 45  BILITOT 2.2*  --  0.9 0.8  GFRNONAA  --  >60 >60 >60  ANIONGAP  --  9 9 8     Lipids No results for input(s): CHOL, TRIG, HDL, LABVLDL, LDLCALC, CHOLHDL in the  last 168 hours.  Hematology Recent Labs  Lab 05/07/21 0313 05/08/21 0259 05/09/21 0252  WBC 6.4 7.0 7.0  RBC 4.96 4.48 4.54  HGB 14.4 13.1 13.0  HCT 43.3 39.7 39.9  MCV 87.3 88.6 87.9  MCH 29.0 29.2 28.6  MCHC 33.3 33.0 32.6  RDW 14.3 14.2 13.9  PLT 131* 158 203   Thyroid  Recent Labs  Lab 05/08/21 0259  TSH 3.474    BNP Recent Labs  Lab 05/06/21 1154  BNP 1,019.0*    Drugs of Abuse     Component Value Date/Time   LABOPIA NONE DETECTED 05/08/2021 1217   COCAINSCRNUR NONE DETECTED 05/08/2021 1217   LABBENZ NONE DETECTED 05/08/2021 1217   AMPHETMU NONE DETECTED 05/08/2021 1217   THCU NONE DETECTED 05/08/2021 1217   LABBARB NONE DETECTED 05/08/2021 1217     Lab Results  Component Value Date   TSH 3.474 05/08/2021   No results found for: HGBA1C  DDimer No results for input(s): DDIMER in the last 168 hours.   Radiology    DG CHEST PORT 1 VIEW  Result Date: 05/09/2021 CLINICAL DATA:  Shortness of breath EXAM: PORTABLE CHEST 1 VIEW COMPARISON:  Chest radiograph 05/06/2021 FINDINGS: The heart is  enlarged, unchanged. The left costophrenic angle is excluded from the field of view. There is increasing retrocardiac opacity which could reflect developing infection in the correct clinical setting. The left upper lung is well aerated. There is no focal consolidation on the right. There is no right effusion. There is no pneumothorax. The bones are stable. IMPRESSION: Increasing retrocardiac opacity suspicious for developing infection. The left costophrenic angle is cut off, and a left pleural effusion cannot be excluded. Repeat PA and lateral chest radiographs may be obtained as indicated. Electronically Signed   By: Lesia Hausen M.D.   On: 05/09/2021 08:03   ECHOCARDIOGRAM COMPLETE  Result Date: 05/07/2021    ECHOCARDIOGRAM REPORT   Patient Name:   Harold Bender Date of Exam: 05/07/2021 Medical Rec #:  [id number]    Height:       71.0 in Accession #:    [id number]   Weight:       264.8 lb Date of Birth:  1990/12/05    BSA:          2.376 m Patient Age:    30 years     BP:           186/131 mmHg Patient Gender: M            HR:           110 bpm. Exam Location:  Inpatient Procedure: 2D Echo, Cardiac Doppler, Color Doppler and Intracardiac            Opacification Agent Indications:    HTN/SOB  History:        Patient has no prior history of Echocardiogram examinations.                 Risk Factors:Hypertension.  Sonographer:    Alvera Novel Referring Phys: [id number] JENNIFER YATES  Sonographer Comments: Patient is morbidly obese. Image acquisition challenging due to patient body habitus. IMPRESSIONS  1. Left ventricular ejection fraction, by estimation, is 35 to 40%. The left ventricle has moderately decreased function. The left ventricle demonstrates global hypokinesis. There is mild left ventricular hypertrophy. Left ventricular diastolic parameters are consistent with Grade II diastolic dysfunction (pseudonormalization).  2. Right ventricular systolic function is normal. The right ventricular size is normal.  There is moderately  elevated pulmonary artery systolic pressure. The estimated right ventricular systolic pressure is 47.7 mmHg.  3. Left atrial size was mild to moderately dilated.  4. Right atrial size was mildly dilated.  5. A small pericardial effusion is present.  6. The mitral valve is normal in structure. Trivial mitral valve regurgitation. No evidence of mitral stenosis.  7. The aortic valve is tricuspid. Aortic valve regurgitation is not visualized. Aortic valve sclerosis/calcification is present, without any evidence of aortic stenosis.  8. The inferior vena cava is normal in size with <50% respiratory variability, suggesting right atrial pressure of 8 mmHg. FINDINGS  Left Ventricle: Left ventricular ejection fraction, by estimation, is 35 to 40%. The left ventricle has moderately decreased function. The left ventricle demonstrates global hypokinesis. Definity contrast agent was given IV to delineate the left ventricular endocardial borders. The left ventricular internal cavity size was normal in size. There is mild left ventricular hypertrophy. Left ventricular diastolic parameters are consistent with Grade II diastolic dysfunction (pseudonormalization). Right Ventricle: The right ventricular size is normal. No increase in right ventricular wall thickness. Right ventricular systolic function is normal. There is moderately elevated pulmonary artery systolic pressure. The tricuspid regurgitant velocity is 3.15 m/s, and with an assumed right atrial pressure of 8 mmHg, the estimated right ventricular systolic pressure is 47.7 mmHg. Left Atrium: Left atrial size was mild to moderately dilated. Right Atrium: Right atrial size was mildly dilated. Pericardium: A small pericardial effusion is present. Mitral Valve: The mitral valve is normal in structure. Trivial mitral valve regurgitation. No evidence of mitral valve stenosis. Tricuspid Valve: The tricuspid valve is normal in structure. Tricuspid valve  regurgitation is trivial. Aortic Valve: The aortic valve is tricuspid. Aortic valve regurgitation is not visualized. Aortic valve sclerosis/calcification is present, without any evidence of aortic stenosis. Pulmonic Valve: The pulmonic valve was normal in structure. Pulmonic valve regurgitation is not visualized. Aorta: The aortic root is normal in size and structure. Venous: The inferior vena cava is normal in size with less than 50% respiratory variability, suggesting right atrial pressure of 8 mmHg. IAS/Shunts: No atrial level shunt detected by color flow Doppler.  LEFT VENTRICLE PLAX 2D LVIDd:         5.70 cm      Diastology LVIDs:         4.70 cm      LV e' medial:  8.92 cm/s LV PW:         1.35 cm      LV e' lateral: 14.90 cm/s LV IVS:        1.35 cm LVOT diam:     2.10 cm LV SV:         49 LV SV Index:   21 LVOT Area:     3.46 cm  LV Volumes (MOD) LV vol d, MOD A2C: 178.0 ml LV vol d, MOD A4C: 180.0 ml LV vol s, MOD A2C: 108.0 ml LV vol s, MOD A4C: 112.0 ml LV SV MOD A2C:     70.0 ml LV SV MOD A4C:     180.0 ml LV SV MOD BP:      67.5 ml RIGHT VENTRICLE             IVC RV S prime:     11.50 cm/s  IVC diam: 2.00 cm TAPSE (M-mode): 2.0 cm LEFT ATRIUM              Index        RIGHT ATRIUM  Index LA diam:        5.05 cm  2.13 cm/m   RA Area:     22.30 cm LA Vol (A2C):   122.0 ml 51.36 ml/m  RA Volume:   63.80 ml  26.86 ml/m LA Vol (A4C):   80.4 ml  33.84 ml/m LA Biplane Vol: 103.0 ml 43.36 ml/m  AORTIC VALVE LVOT Vmax:   96.70 cm/s LVOT Vmean:  68.900 cm/s LVOT VTI:    0.141 m  AORTA Ao Root diam: 3.30 cm Ao Asc diam:  3.30 cm TRICUSPID VALVE TR Peak grad:   39.7 mmHg TR Vmax:        315.00 cm/s  SHUNTS Systemic VTI:  0.14 m Systemic Diam: 2.10 cm Dalton McleanMD Electronically signed by Wilfred Lacy Signature Date/Time: 05/07/2021/5:17:04 PM    Final     Cardiac Studies   ECHO: 05/07/2021  1. Left ventricular ejection fraction, by estimation, is 35 to 40%. The  left ventricle has  moderately decreased function. The left ventricle  demonstrates global hypokinesis. There is mild left ventricular  hypertrophy. Left ventricular diastolic parameters are consistent with Grade II diastolic dysfunction (pseudonormalization).   2. Right ventricular systolic function is normal. The right ventricular  size is normal. There is moderately elevated pulmonary artery systolic pressure. The estimated right ventricular systolic pressure is 47.7 mmHg.   3. Left atrial size was mild to moderately dilated.   4. Right atrial size was mildly dilated.   5. A small pericardial effusion is present.   6. The mitral valve is normal in structure. Trivial mitral valve  regurgitation. No evidence of mitral stenosis.   7. The aortic valve is tricuspid. Aortic valve regurgitation is not  visualized. Aortic valve sclerosis/calcification is present, without any  evidence of aortic stenosis.   8. The inferior vena cava is normal in size with <50% respiratory  variability, suggesting right atrial pressure of 8 mmHg.   Patient Profile     30 y.o. male w/ hx HTN, polysubs abuse was admitted 11/15 with cough, SOB, abd tightness, HTN emergency. Cards saw for HTN, CHF, elevated troponin.  Assessment & Plan    #Newly Diagnosed Systolic Heart Failure: #Suspected HTN-Induced CM: Patient presents with worsening dyspnea, cough and orthopnea. Notably has poorly controlled HTN as has been off medications for over 2 years due to inability to afford them. Also with cocaine use. Here, patient grossly overloaded on exam with pulmonary edema on CXR. BNP 1000.  TTE here with LVEF 35-40% with global hypokinesis, mild LVH, G2DD, moderate PHTN. Now improving with diuresis and BP management. -TTE with depressed EF (35-40%) with global hypokinesis likely secondary to hypertension induced CM -Will need ischemic evaluation as out-patient -Stop nitro gtt -Continue lasix 40mg  IV daily; hopefully transition to PO lasix tomorrow  vs Sunday -Increase entresto to 49/51mg  BID -Increase coreg to 25mg  BID -Continue spiro 25mg  daily -Increase hydralazine to 50mg  TID -Start farxiga 10mg  daily -Discussed importance of abstaining from cocaine, alcohol -Monitor I/Os and daily weights -Low Na diet  #HTN Emergency: Very elevated on arrival. Placed on nitro gtt. Now improving with medication titration. -Stop nitro gtt -Continue amlodipine 10mg  daily -Increase entresto to 49/51mg  BID -Increase coreg to 25mg  BID -Continue spiro 25mg  daily -Increase hydralazine to 50mg  TID  -Needs sleep study for suspected OSA - Would benefit from seeing HTN clinic  #Suspected OSA: -Needs out-patient sleep study; may be contributing to difficult to control HTN  #Polysubstance Abuse: Patient reports tobacco, alcohol, marijuana, and cocaine  use. He smokes cigarrettes primarily on the weekends but endorses daily marijuana use. He last used cocaine 2 weeks ago. Very motivated to quit. -Continue to encourage cessation  For questions or updates, please contact [facility] Please consult [email or web address] for contact info under        Signed, Meriam Sprague, MD  05/09/2021, 2:38 PM

## 2021-05-09 NOTE — Progress Notes (Addendum)
Pt loss both IV access by 0419, so Nitro needed to be paused.  IV RN on unit placed midline to RA; Linton Flemings NP notified of pause of almost an hour - instructions to restart nitro at half and titrate from there

## 2021-05-09 NOTE — Progress Notes (Signed)
PROGRESS NOTE    Harold Bender  NFA:[id number] DOB: 12/14/1990 DOA: 05/06/2021 PCP: Patient, No Pcp Per (Inactive)   Brief Narrative:  Patient is a 30 year old African-American obese male with past medical history significant for but limited to hypertension noncompliance of medications as well as other comorbidities who presented with a cough.  He reports that the cough is recurrent.  For last 2 days it was unbearable and a chest and abdominal pain.  Also has not taken his blood pressure medication up to 3 years given that he is unable to afford.  He had a mild fever Monday briefly.  Last stated that he started having coughing the night before last.  And chest pain with coughing will radiate into his ribs.  He presented to urgent care and was given Cozaar.  He is then brought to the ED and had a cough and shortness of breath.  He had a pneumonia on chest x-ray and profoundly elevated blood pressure in the setting of cocaine.  He started on antibiotics and was given a dose of Cardizem and clonidine.  He was admitted for hypertensive urgency and uncontrolled hypertension and was found to have an elevated BNP.  Because he continued to have elevated blood pressures cardiology was consulted for further evaluation and have initiated the patient on a nitro glycerin drip.  Cardiology is also adjusting his medications further given his new onset acute combined systolic and diastolic CHF and suspected hypertension induced cardiomyopathy  Assessment & Plan:   Principal Problem:   Multifocal pneumonia Active Problems:   Uncontrolled hypertension   Elevated brain natriuretic peptide (BNP) level   Prolonged QT interval   Polysubstance abuse (HCC)  Multifocal PNA, improving slowly -Presented with a Cough, Subjective Fever, Tachypnea and Patchy Perihilar Infiltrates on CXR -Chest x-ray from 05/06/2021 showed "Cardiomegaly. Mediastinal shadows are normal. There is central bronchial thickening. There is patchy  perihilar density right more than left consistent with mild pneumonia. No lobar consolidation or collapse. No effusion. Bony structures unremarkable."  -Appears to be most likely a CAP but possibly viral -Influenza A and COVID-19 Negative -Ordering Gram Stain and Cx, GAS, and Lower Respiratory Tract PCT Level -CURB-65 is 1-2 and was admitted for further Evaluation and Treatment  -PSI was Class 2 -Started on Azithromycin and Ceftriaxone but have changed azithromycin to doxycycline given prolonged QT -C/w Antipyretics -Added Albuterol 2 puff IH q2h PRN and Guaifenesin 600 mg po BIDprn -Chest x-ray this morning showed " -Repeat CXR in the AM and will need an Ambulatory Home O2 Screen prior to D/C   Uncontrolled HTN/Hypertensive Urgency -Checking Troponin and initial was 46 and trended down to 35 -Known history of hypertension has not taken medication for several years and was on olmesartan previously but would not combine it due to lack of insurance and cost of medication -He now has insurance and is planning to start taking medications -Was given Cozaar at the urgent care yesterday morning and took his first dose -Increase his Cozaar dose to 50 mg p.o. daily but now cardiology is changing to Entresto 24/26 mg p.o. twice daily and increasing to 49/51 mg twice daily and increasing carvedilol to 25 mg p.o. twice daily -He was given Cardizem and clonidine in the ED without significant improvement -He was added on as needed hydralazine as well as as needed labetalol -Cardiology's been consulted and his blood pressure was as high as 232/148 on admission -Initiated on amlodipine 10 mg p.o. daily and will cardiology added carvedilol yesterday and  increase the dose today -Patient was also placed on p.o. hydralazine which is now increased to 50 mg 3 times daily -Cardiology is recommending starting the patient on IV nitro drip given his systolic pressures in the 200s and given his elevated BNP and symptoms  of CHF and also started IV Lasix 40 mg daily as well as spironolactone 25 mg p.o. daily; will continue on the nitro drip for now for blood pressure control and afterload reduction; see below -His nitro drip has now been discontinued and he can be transferred to [facility] -Cardiology feels that he has some untreated sleep apnea and recommending outpatient sleep study -Cardiology is also considering renal artery ultrasound and importance of cessation of tobacco and cocaine use -Cardiology feels he will need outpatient ischemic evaluation given his cardiomyopathy -Continue to monitor blood pressures per protocol; last blood pressure reading was 159/97  Suspected Sleep Apnea -Has admitted to snoring and wife has described apneic episode -The patient will need outpatient sleep study  Polysubstance Abuse -Patient admitted Daily marijuana use with heavy weekend ETOH/tobacco/cocaine use -Cessation encouraged; this should be encouraged on an ongoing basis; Patient admits to using Cocaine 2 weeks ago -UDS ordered  Tobacco Dependence -Encouraged cessation; this was discussed with the patient and should be reviewed on an ongoing basis.   -Patch ordered at patient request.  Prolonged QT -Likely assosciated with Infection from Multifocal PNA but could be related to drug use -Initial EKG showed a QTC of 618 ms -Avoid QT Prolonging medications including but not limited to PPI, Antiemetics, and SSRIs -Repeat EKG this AM has normalized to 470 on repeat -Correct electrolytes including potassium and magnesium; potassium was 3.4 yesterday and is now 4.1 today and magnesium is 2.0 -TSH was 3.474   Acute Combined Systolic and Diastolic CHF Suspected Hypertensive Cardiomyopathy and associated moderate pulm hypertension -No prior Baseline avilable but was 1,019.0 on Admisison -No overt signs of Volume overload but did have mild LE edema  -Given Cocaine Use, Prolonged QT, and Uncontrolled HTN, Echo has been  ordered for further evaluation and Recommendations  -Cardiology consulted for further evaluation and management patient did endorse some CHF symptoms including dyspnea, orthopnea and PND as well as abdominal distention. -Cardiogram was done and showed an EF of 35 to 40% with global hypokinesis as well as grade 2 diastolic dysfunction -Cardiology feels that he will need outpatient ischemic evaluation and recommending continue nitro drip for blood pressure control and afterload reduction -Cardiology is continue diurese the patient with IV Lasix given his suspected hypertensive cardiomyopathy -They are recommending continue amlodipine 10 mg p.o. daily and changing losartan to Entresto 24/26 mg twice daily and increase to 49/51 twice daily -They are also going to start hydralazine 25 mg 3 times daily and uptitrate as necessary and increased to 50 mg 3 times daily -We will continue spironolactone 12.5 mg p.o. daily and his carvedilol was started at 12.5 mg p.o. twice daily increase to 25 mg twice daily per cardiology -Cardiology has started an SGLT2 inhibitor with Farxiga 10 mg daily-They are recommending strict I's and O's and daily weights -Cardiology suspect some hypertensive cardiomyopathy and initiated the patient on IV Lasix 40 mg p.o. daily and they are recommending continuing this again today and hopefully transition to p.o. tomorrow or Sunday -They are recommending strict I's and O's and daily weights and continue to monitor his renal function carefully -Patient is -1.824 L since admission -We will need an Ambulatory home O2 screen prior to discharge  Hypokalemia -Improved.  K+ went from 3.4 -> 4.1 -> 3.1 and further improved to 3.6 -Treat with p.o. KCl 40 mg twice daily x2 doses and cardiology will give an extra dose today -Continue to Monitor and Trend -Repeat CMP in the AM  Thrombocytopenia -Platelet count went from 156 and trended down to 131 but is now trended back up to 158 -Continue  to monitor and trend and continue to monitor for signs and symptoms of bleeding; currently no overt bleeding noted -Repeat CBC in a.m.  Hyperbilirubinemia -Elevated and Likely reactive -T Bili as 2.2 (Direct was 0.6 and Indirect was 1.6) and is now improved to 0 point -Continue to Monitor and Trend -Repeat CMP in the AM   Obesity -Complicates overall prognosis and care -Estimated body mass index is 36.93 kg/m as calculated from the following:   Height as of this encounter:  (1.803 m).   Weight as of this encounter: 120.1 kg. -Weight Loss and Dietary Counseling given   DVT prophylaxis: Anticoagulated with Enoxaparin 40 mg sq q24h Code Status: FULL CODE  Family Communication: No family currently at bedside Disposition Plan: Pending further clinical improvement   Status is: Inpatient  Remains inpatient appropriate because: Patient continues to have uncontrolled high blood pressure and is now on a nitro drip with cardiology following and diuresing.  Consultants:  Cardiology  Procedures:  ECHOCARDIOGRAM  IMPRESSIONS     1. Left ventricular ejection fraction, by estimation, is 35 to 40%. The  left ventricle has moderately decreased function. The left ventricle  demonstrates global hypokinesis. There is mild left ventricular  hypertrophy. Left ventricular diastolic  parameters are consistent with Grade II diastolic dysfunction  (pseudonormalization).   2. Right ventricular systolic function is normal. The right ventricular  size is normal. There is moderately elevated pulmonary artery systolic  pressure. The estimated right ventricular systolic pressure is 47.7 mmHg.   3. Left atrial size was mild to moderately dilated.   4. Right atrial size was mildly dilated.   5. A small pericardial effusion is present.   6. The mitral valve is normal in structure. Trivial mitral valve  regurgitation. No evidence of mitral stenosis.   7. The aortic valve is tricuspid. Aortic valve  regurgitation is not  visualized. Aortic valve sclerosis/calcification is present, without any  evidence of aortic stenosis.   8. The inferior vena cava is normal in size with <50% respiratory  variability, suggesting right atrial pressure of 8 mmHg.   FINDINGS   Left Ventricle: Left ventricular ejection fraction, by estimation, is 35  to 40%. The left ventricle has moderately decreased function. The left  ventricle demonstrates global hypokinesis. Definity contrast agent was  given IV to delineate the left  ventricular endocardial borders. The left ventricular internal cavity size  was normal in size. There is mild left ventricular hypertrophy. Left  ventricular diastolic parameters are consistent with Grade II diastolic  dysfunction (pseudonormalization).   Right Ventricle: The right ventricular size is normal. No increase in  right ventricular wall thickness. Right ventricular systolic function is  normal. There is moderately elevated pulmonary artery systolic pressure.  The tricuspid regurgitant velocity is  3.15 m/s, and with an assumed right atrial pressure of 8 mmHg, the  estimated right ventricular systolic pressure is 47.7 mmHg.   Left Atrium: Left atrial size was mild to moderately dilated.   Right Atrium: Right atrial size was mildly dilated.   Pericardium: A small pericardial effusion is present.   Mitral Valve: The mitral valve is normal  in structure. Trivial mitral  valve regurgitation. No evidence of mitral valve stenosis.   Tricuspid Valve: The tricuspid valve is normal in structure. Tricuspid  valve regurgitation is trivial.   Aortic Valve: The aortic valve is tricuspid. Aortic valve regurgitation is  not visualized. Aortic valve sclerosis/calcification is present, without  any evidence of aortic stenosis.   Pulmonic Valve: The pulmonic valve was normal in structure. Pulmonic valve  regurgitation is not visualized.   Aorta: The aortic root is normal in size  and structure.   Venous: The inferior vena cava is normal in size with less than 50%  respiratory variability, suggesting right atrial pressure of 8 mmHg.   IAS/Shunts: No atrial level shunt detected by color flow Doppler.      LEFT VENTRICLE  PLAX 2D  LVIDd:         5.70 cm      Diastology  LVIDs:         4.70 cm      LV e' medial:  8.92 cm/s  LV PW:         1.35 cm      LV e' lateral: 14.90 cm/s  LV IVS:        1.35 cm  LVOT diam:     2.10 cm  LV SV:         49  LV SV Index:   21  LVOT Area:     3.46 cm     LV Volumes (MOD)  LV vol d, MOD A2C: 178.0 ml  LV vol d, MOD A4C: 180.0 ml  LV vol s, MOD A2C: 108.0 ml  LV vol s, MOD A4C: 112.0 ml  LV SV MOD A2C:     70.0 ml  LV SV MOD A4C:     180.0 ml  LV SV MOD BP:      67.5 ml   RIGHT VENTRICLE             IVC  RV S prime:     11.50 cm/s  IVC diam: 2.00 cm  TAPSE (M-mode): 2.0 cm   LEFT ATRIUM              Index        RIGHT ATRIUM           Index  LA diam:        5.05 cm  2.13 cm/m   RA Area:     22.30 cm  LA Vol (A2C):   122.0 ml 51.36 ml/m  RA Volume:   63.80 ml  26.86 ml/m  LA Vol (A4C):   80.4 ml  33.84 ml/m  LA Biplane Vol: 103.0 ml 43.36 ml/m   AORTIC VALVE  LVOT Vmax:   96.70 cm/s  LVOT Vmean:  68.900 cm/s  LVOT VTI:    0.141 m     AORTA  Ao Root diam: 3.30 cm  Ao Asc diam:  3.30 cm   TRICUSPID VALVE  TR Peak grad:   39.7 mmHg  TR Vmax:        315.00 cm/s     SHUNTS  Systemic VTI:  0.14 m  Systemic Diam: 2.10 cm   Antimicrobials:  Anti-infectives (From admission, onward)    Start     Dose/Rate Route Frequency Ordered Stop   05/07/21 1500  doxycycline (VIBRAMYCIN) 100 mg in sodium chloride 0.9 % 250 mL IVPB        100 mg 125 mL/hr over 120 Minutes Intravenous Every 12 hours  05/07/21 1432     05/06/21 1500  cefTRIAXone (ROCEPHIN) 1 g in sodium chloride 0.9 % 100 mL IVPB        1 g 200 mL/hr over 30 Minutes Intravenous Every 24 hours 05/06/21 1445     05/06/21 1500  azithromycin (ZITHROMAX) 500  mg in sodium chloride 0.9 % 250 mL IVPB  Status:  Discontinued        500 mg 250 mL/hr over 60 Minutes Intravenous Every 24 hours 05/06/21 1445 05/07/21 1432        Subjective: In and examined at bedside and he thinks he is getting a little bit better.  Blood pressure is better controlled.  No nausea or vomiting.  Still has some cough.  Denies any chest pain or shortness of breath.  No other concerns or complaints this time.  Objective: Vitals:   05/09/21 1100 05/09/21 1200 05/09/21 1219 05/09/21 1300  BP: (!) 164/100 (!) 151/86  (!) 142/70  Pulse: 100 (!) 102  (!) 101  Resp: (!) 30 (!) 24  (!) 47  Temp:   98.2 F (36.8 C)   TempSrc:   Oral   SpO2: 98% 96%  91%  Weight:      Height:        Intake/Output Summary (Last 24 hours) at 05/09/2021 1640 Last data filed at 05/09/2021 1200 Gross per 24 hour  Intake 1999.32 ml  Output 2125 ml  Net -125.68 ml    Filed Weights   05/06/21 1801  Weight: 120.1 kg   Examination: Physical Exam:  Constitutional: WN/WD obese African-American male currently in no acute distress appears calm Eyes: Lids and conjunctivae normal, sclerae anicteric  ENMT: External Ears, Nose appear normal. Grossly normal hearing. Mucous membranes are moist.  Neck: Appears normal, supple, no cervical masses, normal ROM, no appreciable thyromegaly; no appreciable JVD Respiratory: Mildly diminished to auscultation bilaterally with coarse breath sounds, no wheezing, rales, rhonchi or crackles. Normal respiratory effort and patient is not tachypenic. No accessory muscle use.  Unlabored breathing Cardiovascular: Tachycardic rate but regular rhythm, no murmurs / rubs / gallops. S1 and S2 auscultated.  Very slight extremity edema Abdomen: Soft, non-tender, distended secondary body habitus. Bowel sounds positive.  GU: Deferred. Musculoskeletal: No clubbing / cyanosis of digits/nails.  Normal strength and muscle tone.  Skin: No rashes, lesions, ulcers on limited skin  evaluation. No induration; Warm and dry.  Neurologic: CN 2-12 grossly intact with no focal deficits. Romberg sign and cerebellar reflexes not assessed.  Psychiatric: Normal judgment and insight. Alert and oriented x 3. Normal mood and appropriate affect.   Data Reviewed: I have personally reviewed following labs and imaging studies  CBC: Recent Labs  Lab 05/06/21 1154 05/07/21 0313 05/08/21 0259 05/09/21 0252  WBC 6.4 6.4 7.0 7.0  NEUTROABS 5.3  --  4.3 3.7  HGB 14.4 14.4 13.1 13.0  HCT 41.9 43.3 39.7 39.9  MCV 85.9 87.3 88.6 87.9  PLT 156 131* 158 203    Basic Metabolic Panel: Recent Labs  Lab 05/06/21 1154 05/07/21 0246 05/07/21 0313 05/08/21 0259 05/09/21 0252  NA 137  --  139 138 137  K 3.4*  --  4.1 3.1* 3.6  CL 107  --  108 107 108  CO2 21*  --  22 22 21*  GLUCOSE 127*  --  102* 107* 106*  BUN 12  --  CREATININE 1.19  --  1.17 1.15 1.09  CALCIUM 8.7*  --  8.7* 8.6* 8.4*  MG  --  2.0  --  1.9 1.7  PHOS  --  3.4  --  3.3 3.2    GFR: Estimated Creatinine Clearance: 128.3 mL/min (by C-G formula based on SCr of 1.09 mg/dL). Liver Function Tests: Recent Labs  Lab 05/07/21 0246 05/08/21 0259 05/09/21 0252  AST 40 37 22  ALT 23 29 24   ALKPHOS 53 47 45  BILITOT 2.2* 0.9 0.8  PROT 6.7 6.2* 6.2*  ALBUMIN 3.4* 3.1* 3.3*    No results for input(s): LIPASE, AMYLASE in the last 168 hours. No results for input(s): AMMONIA in the last 168 hours. Coagulation Profile: No results for input(s): INR, PROTIME in the last 168 hours. Cardiac Enzymes: No results for input(s): CKTOTAL, CKMB, CKMBINDEX, TROPONINI in the last 168 hours. BNP (last 3 results) No results for input(s): PROBNP in the last 8760 hours. HbA1C: No results for input(s): HGBA1C in the last 72 hours. CBG: Recent Labs  Lab 05/08/21 0544  GLUCAP 86    Lipid Profile: No results for input(s): CHOL, HDL, LDLCALC, TRIG, CHOLHDL, LDLDIRECT in the last 72 hours. Thyroid Function  Tests: Recent Labs    05/08/21 0259  TSH 3.474    Anemia Panel: No results for input(s): VITAMINB12, FOLATE, FERRITIN, TIBC, IRON, RETICCTPCT in the last 72 hours. Sepsis Labs: Recent Labs  Lab 05/06/21 1154 05/06/21 1320  PROCALCITON 0.14  --   LATICACIDVEN  --  1.2    Recent Results (from the past 240 hour(s))  Culture, blood (Routine X 2) w Reflex to ID Panel     Status: None (Preliminary result)   Collection Time: 05/06/21  1:19 PM   Specimen: BLOOD LEFT HAND  Result Value Ref Range Status   Specimen Description   Final    BLOOD LEFT HAND Performed at [facility], [address] [place], [place]    Special Requests   Final    BOTTLES DRAWN AEROBIC AND ANAEROBIC Blood Culture results may not be optimal due to an inadequate volume of blood received in culture bottles Performed at [facility], [address] [place], [place]    Culture   Final    NO GROWTH 3 DAYS Performed at [facility], [address] [place], [place]    Report Status PENDING  Incomplete  Culture, blood (Routine X 2) w Reflex to ID Panel     Status: None (Preliminary result)   Collection Time: 05/06/21  1:20 PM   Specimen: BLOOD  Result Value Ref Range Status   Specimen Description   Final    BLOOD LEFT ANTECUBITAL Performed at [facility], [address], [place], [place]    Special Requests   Final    BOTTLES DRAWN AEROBIC AND ANAEROBIC Blood Culture adequate volume Performed at [facility], [address] [place], [place]    Culture   Final    NO GROWTH 3 DAYS Performed at [facility], [address], [place], [place]    Report Status PENDING  Incomplete  Resp Panel by RT-PCR (Flu A&B, Covid) Nasopharyngeal Swab     Status: None   Collection Time: 05/06/21  1:20 PM   Specimen: Nasopharyngeal Swab; Nasopharyngeal(NP) swabs in vial  transport medium  Result Value Ref Range Status   SARS Coronavirus 2 by RT PCR NEGATIVE NEGATIVE Final    Comment: (NOTE) SARS-CoV-2 target nucleic acids are NOT DETECTED.  The  SARS-CoV-2 RNA is generally detectable in upper respiratory specimens during the acute phase of infection. The lowest concentration of SARS-CoV-2 viral copies this assay can detect is 138 copies/mL. A negative result does not preclude SARS-Cov-2 infection and should not be used as the sole basis for treatment or other patient management decisions. A negative result may occur with  improper specimen collection/handling, submission of specimen other than nasopharyngeal swab, presence of viral mutation(s) within the areas targeted by this assay, and inadequate number of viral copies(<138 copies/mL). A negative result must be combined with clinical observations, patient history, and epidemiological information. The expected result is Negative.  Fact Sheet for Patients:  BloggerCourse.com  Fact Sheet for Healthcare Providers:  SeriousBroker.it  This test is no t yet approved or cleared by the [place] FDA and  has been authorized for detection and/or diagnosis of SARS-CoV-2 by FDA under an Emergency Use Authorization (EUA). This EUA will remain  in effect (meaning this test can be used) for the duration of the COVID-19 declaration under Section 564(b)(1) of the Act, 21 U.S.C.section 360bbb-3(b)(1), unless the authorization is terminated  or revoked sooner.       Influenza A by PCR NEGATIVE NEGATIVE Final   Influenza B by PCR NEGATIVE NEGATIVE Final    Comment: (NOTE) The Xpert Xpress SARS-CoV-2/FLU/RSV plus assay is intended as an aid in the diagnosis of influenza from Nasopharyngeal swab specimens and should not be used as a sole basis for treatment. Nasal washings and aspirates are unacceptable for Xpert Xpress SARS-CoV-2/FLU/RSV testing.  Fact  Sheet for Patients: BloggerCourse.com  Fact Sheet for Healthcare Providers: SeriousBroker.it  This test is not yet approved or cleared by the [place] FDA and has been authorized for detection and/or diagnosis of SARS-CoV-2 by FDA under an Emergency Use Authorization (EUA). This EUA will remain in effect (meaning this test can be used) for the duration of the COVID-19 declaration under Section 564(b)(1) of the Act, 21 U.S.C. section 360bbb-3(b)(1), unless the authorization is terminated or revoked.  Performed at [facility], [address] [place], [place] [address]   MRSA Next Gen by PCR, Nasal     Status: None   Collection Time: 05/06/21  6:03 PM   Specimen: Nasal Mucosa; Nasal Swab  Result Value Ref Range Status   MRSA by PCR Next Gen NOT DETECTED NOT DETECTED Final    Comment: (NOTE) The GeneXpert MRSA Assay (FDA approved for NASAL specimens only), is one component of a comprehensive MRSA colonization surveillance program. It is not intended to diagnose MRSA infection nor to guide or monitor treatment for MRSA infections. Test performance is not FDA approved in patients less than 67 years old. Performed at [facility], [address] [place], [place] [address]     RN Pressure Injury Documentation:     Estimated body mass index is 36.93 kg/m as calculated from the following:   Height as of this encounter: 5\' 11"  (1.803 m).   Weight as of this encounter: 120.1 kg.  Malnutrition Type:   Malnutrition Characteristics:   Nutrition Interventions:   Radiology Studies: DG CHEST PORT 1 VIEW  Result Date: 05/09/2021 CLINICAL DATA:  Shortness of breath EXAM: PORTABLE CHEST 1 VIEW COMPARISON:  Chest radiograph 05/06/2021 FINDINGS: The heart is enlarged, unchanged. The left costophrenic angle is excluded from the field of view. There is increasing retrocardiac opacity which could  reflect developing infection in the correct clinical setting. The left upper lung is well aerated. There is  no focal consolidation on the right. There is no right effusion. There is no pneumothorax. The bones are stable. IMPRESSION: Increasing retrocardiac opacity suspicious for developing infection. The left costophrenic angle is cut off, and a left pleural effusion cannot be excluded. Repeat PA and lateral chest radiographs may be obtained as indicated. Electronically Signed   By: Lesia Hausen M.D.   On: 05/09/2021 08:03    Scheduled Meds:  amLODipine  10 mg Oral Daily   carvedilol  25 mg Oral BID WC   Chlorhexidine Gluconate Cloth  6 each Topical Daily   dapagliflozin propanediol  10 mg Oral Daily   docusate sodium  100 mg Oral BID   enoxaparin (LOVENOX) injection  60 mg Subcutaneous Q24H   furosemide  40 mg Intravenous Daily   hydrALAZINE  50 mg Oral TID   melatonin  3 mg Oral QHS   nicotine  7 mg Transdermal Daily   sacubitril-valsartan  1 tablet Oral BID   sodium chloride flush  10-40 mL Intracatheter Q12H   sodium chloride flush  3 mL Intravenous Q12H   spironolactone  25 mg Oral Daily   Continuous Infusions:  cefTRIAXone (ROCEPHIN)  IV Stopped (05/08/21 1725)   doxycycline (VIBRAMYCIN) IV Stopped (05/09/21 7510)    LOS: 3 days   Merlene Laughter, DO [facility] is on AMION  If 7PM-7AM, please contact night-coverage [email or web address]

## 2021-05-10 ENCOUNTER — Encounter (HOSPITAL_COMMUNITY): Payer: Self-pay

## 2021-05-10 DIAGNOSIS — I10 Essential (primary) hypertension: Secondary | ICD-10-CM

## 2021-05-10 DIAGNOSIS — I420 Dilated cardiomyopathy: Secondary | ICD-10-CM

## 2021-05-10 LAB — CBC WITH DIFFERENTIAL/PLATELET
Abs Immature Granulocytes: 0.02 10*3/uL (ref 0.00–0.07)
Basophils Absolute: 0 10*3/uL (ref 0.0–0.1)
Basophils Relative: 1 %
Eosinophils Absolute: 0.2 10*3/uL (ref 0.0–0.5)
Eosinophils Relative: 3 %
HCT: 46.1 % (ref 39.0–52.0)
Hemoglobin: 15.5 g/dL (ref 13.0–17.0)
Immature Granulocytes: 0 %
Lymphocytes Relative: 44 %
Lymphs Abs: 2.7 10*3/uL (ref 0.7–4.0)
MCH: 29.1 pg (ref 26.0–34.0)
MCHC: 33.6 g/dL (ref 30.0–36.0)
MCV: 86.5 fL (ref 80.0–100.0)
Monocytes Absolute: 0.6 10*3/uL (ref 0.1–1.0)
Monocytes Relative: 10 %
Neutro Abs: 2.5 10*3/uL (ref 1.7–7.7)
Neutrophils Relative %: 42 %
Platelets: 212 10*3/uL (ref 150–400)
RBC: 5.33 MIL/uL (ref 4.22–5.81)
RDW: 14.1 % (ref 11.5–15.5)
WBC: 6 10*3/uL (ref 4.0–10.5)
nRBC: 0 % (ref 0.0–0.2)

## 2021-05-10 LAB — COMPREHENSIVE METABOLIC PANEL
ALT: 25 U/L (ref 0–44)
AST: 28 U/L (ref 15–41)
Albumin: 3.4 g/dL — ABNORMAL LOW (ref 3.5–5.0)
Alkaline Phosphatase: 53 U/L (ref 38–126)
Anion gap: 8 (ref 5–15)
BUN: 12 mg/dL (ref 6–20)
CO2: 22 mmol/L (ref 22–32)
Calcium: 8.6 mg/dL — ABNORMAL LOW (ref 8.9–10.3)
Chloride: 108 mmol/L (ref 98–111)
Creatinine, Ser: 1.1 mg/dL (ref 0.61–1.24)
GFR, Estimated: >60 mL/min (ref >60)
Glucose, Bld: 112 mg/dL — ABNORMAL HIGH (ref 70–99)
Potassium: 3.6 mmol/L (ref 3.5–5.1)
Sodium: 138 mmol/L (ref 135–145)
Total Bilirubin: 0.6 mg/dL (ref 0.3–1.2)
Total Protein: 6.8 g/dL (ref 6.5–8.1)

## 2021-05-10 LAB — PHOSPHORUS: Phosphorus: 4.9 mg/dL — ABNORMAL HIGH (ref 2.5–4.6)

## 2021-05-10 LAB — MAGNESIUM: Magnesium: 2.2 mg/dL (ref 1.7–2.4)

## 2021-05-10 MED ORDER — DAPAGLIFLOZIN PROPANEDIOL 10 MG PO TABS: 10.0000 mg | ORAL_TABLET | Freq: Every day | ORAL | 0 refills | Status: AC

## 2021-05-10 MED ORDER — HYDRALAZINE HCL 25 MG PO TABS: 75.0000 mg | ORAL_TABLET | Freq: Three times a day (TID) | ORAL | 0 refills | Status: DC

## 2021-05-10 MED ORDER — ALBUTEROL SULFATE HFA 108 (90 BASE) MCG/ACT IN AERS: 2.0000 | INHALATION_SPRAY | RESPIRATORY_TRACT | 0 refills | Status: AC | PRN

## 2021-05-10 MED ORDER — SACUBITRIL-VALSARTAN 49-51 MG PO TABS
1.0000 | ORAL_TABLET | Freq: Two times a day (BID) | ORAL | 0 refills | Status: DC
Start: 2021-05-10 — End: 2021-06-05

## 2021-05-10 MED ORDER — CARVEDILOL 25 MG PO TABS: 25.0000 mg | ORAL_TABLET | Freq: Two times a day (BID) | ORAL | 0 refills | Status: DC

## 2021-05-10 MED ORDER — SPIRONOLACTONE 25 MG PO TABS: 25.0000 mg | ORAL_TABLET | Freq: Every day | ORAL | 0 refills | Status: DC

## 2021-05-10 MED ORDER — POTASSIUM CHLORIDE CRYS ER 20 MEQ PO TBCR
40.0000 meq | EXTENDED_RELEASE_TABLET | Freq: Once | ORAL | Status: AC
  Administered 2021-05-10: 40 meq via ORAL
  Filled 2021-05-10: qty 2

## 2021-05-10 MED ORDER — CEFDINIR 300 MG PO CAPS: 300.0000 mg | ORAL_CAPSULE | Freq: Two times a day (BID) | ORAL | 0 refills | Status: AC

## 2021-05-10 MED ORDER — FUROSEMIDE 40 MG PO TABS
40.0000 mg | ORAL_TABLET | Freq: Every day | ORAL | Status: DC
  Administered 2021-05-10: 40 mg via ORAL
  Filled 2021-05-10: qty 1

## 2021-05-10 MED ORDER — ACETAMINOPHEN 325 MG PO TABS: 650.0000 mg | ORAL_TABLET | Freq: Four times a day (QID) | ORAL | 0 refills | Status: AC | PRN

## 2021-05-10 MED ORDER — GUAIFENESIN ER 600 MG PO TB12: 600.0000 mg | ORAL_TABLET | Freq: Two times a day (BID) | ORAL | 0 refills | Status: AC | PRN

## 2021-05-10 MED ORDER — DOXYCYCLINE HYCLATE 100 MG PO TABS: 100.0000 mg | ORAL_TABLET | Freq: Two times a day (BID) | ORAL | 0 refills | Status: AC

## 2021-05-10 MED ORDER — ISOSORBIDE MONONITRATE ER 60 MG PO TB24
30.0000 mg | ORAL_TABLET | Freq: Every day | ORAL | Status: DC
  Administered 2021-05-10: 30 mg via ORAL
  Filled 2021-05-10: qty 1

## 2021-05-10 MED ORDER — HYDRALAZINE HCL 50 MG PO TABS: 75.0000 mg | ORAL_TABLET | Freq: Three times a day (TID) | ORAL | Status: DC

## 2021-05-10 MED ORDER — ISOSORBIDE MONONITRATE ER 30 MG PO TB24: 30.0000 mg | ORAL_TABLET | Freq: Every day | ORAL | 0 refills | Status: DC

## 2021-05-10 MED ORDER — FUROSEMIDE 40 MG PO TABS: 40.0000 mg | ORAL_TABLET | Freq: Every day | ORAL | 0 refills | Status: DC

## 2021-05-10 NOTE — Progress Notes (Addendum)
I have sent a message to our office's scheduling team requesting follow-up appointments, and our office will call the patient with this information.  

## 2021-05-10 NOTE — Progress Notes (Addendum)
Progress Note  Patient Name: Harold Bender Date of Encounter: 05/10/2021  [facility] Cardiologist: Meriam Sprague, MD   Subjective   No CP or dyspnea  Inpatient Medications    Scheduled Meds:  amLODipine  10 mg Oral Daily   carvedilol  25 mg Oral BID WC   Chlorhexidine Gluconate Cloth  6 each Topical Daily   dapagliflozin propanediol  10 mg Oral Daily   docusate sodium  100 mg Oral BID   enoxaparin (LOVENOX) injection  60 mg Subcutaneous Q24H   furosemide  40 mg Intravenous Daily   hydrALAZINE  50 mg Oral TID   melatonin  3 mg Oral QHS   sacubitril-valsartan  1 tablet Oral BID   sodium chloride flush  10-40 mL Intracatheter Q12H   sodium chloride flush  3 mL Intravenous Q12H   spironolactone  25 mg Oral Daily   Continuous Infusions:  cefTRIAXone (ROCEPHIN)  IV Stopped (05/09/21 1807)   doxycycline (VIBRAMYCIN) IV Stopped (05/10/21 0503)   PRN Meds: acetaminophen **OR** acetaminophen, albuterol, bisacodyl, guaiFENesin, hydrALAZINE, hydrOXYzine, labetalol, morphine injection, oxyCODONE, polyethylene glycol, sodium chloride flush   Vital Signs    Vitals:   05/10/21 0605 05/10/21 0757 05/10/21 0800 05/10/21 0826  BP: 124/77  (!) 151/102   Pulse: 92 (!) 102 99   Resp: (!) 22  16   Temp:    98 F (36.7 C)  TempSrc:    Oral  SpO2: 96%  100%   Weight:      Height:        Intake/Output Summary (Last 24 hours) at 05/10/2021 0951 Last data filed at 05/10/2021 0650 Gross per 24 hour  Intake 1328.16 ml  Output 2425 ml  Net -1096.84 ml   Last 3 Weights 05/06/2021  Weight (lbs) 264 lb 12.4 oz  Weight (kg) 120.1 kg      Telemetry    Sinus with transient Mobitz 1 second-degree AV block and transietn junctional rhythm while asleep- Personally Reviewed  Physical Exam   GEN: No acute distress.   Neck: No JVD Cardiac: RRR, no murmurs, rubs, or gallops.  Respiratory: Clear to auscultation bilaterally. GI: Soft, nontender, non-distended  MS: No  edema Neuro:  Nonfocal  Psych: Normal affect   Labs    High Sensitivity Troponin:   Recent Labs  Lab 05/07/21 0935 05/07/21 1424  TROPONINIHS 46* 35*     Chemistry Recent Labs  Lab 05/08/21 0259 05/09/21 0252 05/10/21 0306  NA 138 137 138  K 3.1* 3.6 3.6  CL 107 108 108  CO2 22 21* 22  GLUCOSE 107* 106* 112*  BUN 11 8 12   CREATININE 1.15 1.09 1.10  CALCIUM 8.6* 8.4* 8.6*  MG 1.9 1.7 2.2  PROT 6.2* 6.2* 6.8  ALBUMIN 3.1* 3.3* 3.4*  AST 37 22 28  ALT 29 24 25   ALKPHOS 47 45 53  BILITOT 0.9 0.8 0.6  GFRNONAA >60 >60 >60  ANIONGAP 9 8 8      Hematology Recent Labs  Lab 05/08/21 0259 05/09/21 0252 05/10/21 0306  WBC 7.0 7.0 6.0  RBC 4.48 4.54 5.33  HGB 13.1 13.0 15.5  HCT 39.7 39.9 46.1  MCV 88.6 87.9 86.5  MCH 29.2 28.6 29.1  MCHC 33.0 32.6 33.6  RDW 14.2 13.9 14.1  PLT 158 203 212   Thyroid  Recent Labs  Lab 05/08/21 0259  TSH 3.474    BNP Recent Labs  Lab 05/06/21 1154  BNP 1,019.0*     Radiology  DG CHEST PORT 1 VIEW  Result Date: 05/09/2021 CLINICAL DATA:  Shortness of breath EXAM: PORTABLE CHEST 1 VIEW COMPARISON:  Chest radiograph 05/06/2021 FINDINGS: The heart is enlarged, unchanged. The left costophrenic angle is excluded from the field of view. There is increasing retrocardiac opacity which could reflect developing infection in the correct clinical setting. The left upper lung is well aerated. There is no focal consolidation on the right. There is no right effusion. There is no pneumothorax. The bones are stable. IMPRESSION: Increasing retrocardiac opacity suspicious for developing infection. The left costophrenic angle is cut off, and a left pleural effusion cannot be excluded. Repeat PA and lateral chest radiographs may be obtained as indicated. Electronically Signed   By: Lesia Hausen M.D.   On: 05/09/2021 08:03     Patient Profile     30 y.o. male w/ hx HTN, polysubs abuse was admitted 11/15 with cough, SOB, abd tightness, HTN  emergency. Cards saw for HTN, CHF, elevated troponin.  Echocardiogram this admission showed ejection fraction 35 to 40%, mild left ventricular hypertrophy, grade 2 diastolic dysfunction, mild to moderate left atrial enlargement, mild right atrial enlargement, small pericardial effusion.  Assessment & Plan    1 cardiomyopathy-felt likely to be secondary to uncontrolled hypertension.  We will continue Entresto and coreg (transient bradycardia noted on telemetry but patient with history of snoring and sleep apnea likely contributing).  We will increase hydralazine to 75 mg p.o. 3 times daily and discontinue amlodipine.  Add isosorbide 30 mg daily.  Continue to titrate medications as tolerated as outpt.  Once fully titrated would repeat echocardiogram in 3 months later.  If LV function remains decreased would need ischemia evaluation at that point.  2 acute combined systolic/diastolic congestive heart failure-volume status has improved.  Transition Lasix to 40 mg by mouth daily.  Continue spironolactone and Farxiga.  3 hypertensive emergency-blood pressure has improved.  Adjust medications for cardiomyopathy as outlined above.  Follow blood pressure and adjust regimen as needed.  4 obstructive sleep apnea-we will need outpatient sleep evaluation.  5 polysubstance abuse-patient educated on the importance of avoiding.  Patient can be discharged from a cardiac standpoint.  We will arrange follow-up with APP in 2 weeks.  We will need to check potassium and renal function at that time.  Follow-up with Dr. Shari Prows in approximately 8 to 12 weeks.  Continue present medications at discharge.  Please call with questions.    For questions or updates, please contact [facility] Please consult [email or web address] for contact info under        Signed, Olga Millers, MD  05/10/2021, 9:51 AM

## 2021-05-10 NOTE — TOC Progression Note (Signed)
Transition of Care [facility]) - Progression Note    Patient Details  Name: Harold Bender MRN: [id number] Date of Birth: 1991/03/15  [facility]) CM/SW Contact  Harold Bers, RN Phone Number: 05/10/2021, 2:36 PM  Clinical Narrative:    Harold Bender is an online coupon is available. Giving pt Entresto co-pay card to use for discount on medications. Pt will need to activate card by calling toll free number. Explained to pt and he understood, teach back. Pt also stated that he went online and has coupon for [place].    Expected Discharge Plan: Home/Self Care Barriers to Discharge: Continued Medical Work up  Expected Discharge Plan and Services Expected Discharge Plan: Home/Self Care   Discharge Planning Services: CM Consult   Living arrangements for the past 2 months: Single Family Home Expected Discharge Date: 05/10/21                                     Social Determinants of Health (SDOH) Interventions    Readmission Risk Interventions No flowsheet data found.

## 2021-05-10 NOTE — Progress Notes (Signed)
At 2336, pt noted with x13 seconds of 3rd degree AVB, asymptomatic, 12 lead EKG with NSR prolonged QT.  Linton Flemings NP notified.  No new orders.

## 2021-05-10 NOTE — Discharge Summary (Signed)
Physician Discharge Summary  Harold Bender ZOX:[id number] DOB: [birth year] DOA: 05/06/2021  PCP: Patient, No Pcp Per (Inactive)  Admit date: 05/06/2021 Discharge date: 05/10/2021  Admitted From: Home Disposition: Home  Recommendations for Outpatient Follow-up:  Follow up with PCP in 1-2 weeks Follow up with Cardiology within 1-2 weeks  Please obtain CMP/CBC, Mag, Phos in one week Please follow up on the following pending results:  Home Health: No  Equipment/Devices: None    Discharge Condition: Stable  CODE STATUS: FULL CODE Diet recommendation: Heart Healthy Diet   Brief/Interim Summary: Patient is a 30 year old African-American obese male with past medical history significant for but limited to hypertension noncompliance of medications as well as other comorbidities who presented with a cough.  He reports that the cough is recurrent.  For last 2 days it was unbearable and a chest and abdominal pain.  Also has not taken his blood pressure medication up to 3 years given that he is unable to afford.  He had a mild fever Monday briefly.  Last stated that he started having coughing the night before last.  And chest pain with coughing will radiate into his ribs.  He presented to urgent care and was given Cozaar.  He is then brought to the ED and had a cough and shortness of breath.  He had a pneumonia on chest x-ray and profoundly elevated blood pressure in the setting of cocaine.  He started on antibiotics and was given a dose of Cardizem and clonidine.  He was admitted for hypertensive urgency and uncontrolled hypertension and was found to have an elevated BNP.  Because he continued to have elevated blood pressures cardiology was consulted for further evaluation and have initiated the patient on a nitro glycerin drip.  Cardiology is also adjusting his medications further given his new onset acute combined systolic and diastolic CHF and suspected hypertension induced cardiomyopathy.  Cardiology  adjusted medications per them patient improved significantly and he is deemed medically stable to be discharged  Discharge Diagnoses:  Principal Problem:   Multifocal pneumonia Active Problems:   Uncontrolled hypertension   Elevated brain natriuretic peptide (BNP) level   Prolonged QT interval   Polysubstance abuse (HCC)  Multifocal PNA, improving slowly -Presented with a Cough, Subjective Fever, Tachypnea and Patchy Perihilar Infiltrates on CXR -Chest x-ray from 05/06/2021 showed "Cardiomegaly. Mediastinal shadows are normal. There is central bronchial thickening. There is patchy perihilar density right more than left consistent with mild pneumonia. No lobar consolidation or collapse. No effusion. Bony structures unremarkable."  -Appears to be most likely a CAP but possibly viral -Influenza A and COVID-19 Negative -Ordering Gram Stain and Cx, GAS, and Lower Respiratory Tract PCT Level -CURB-65 is 1-2 and was admitted for further Evaluation and Treatment  -PSI was Class 2 -Started on Azithromycin and Ceftriaxone but have changed azithromycin to doxycycline given prolonged QT; will change to p.o. antibiotics for 3 more days with doxycycline and p.o. cefdinir -C/w Antipyretics -Added Albuterol 2 puff IH q2h PRN and Guaifenesin 600 mg po BIDprn -Chest x-ray yesterday showed "Increasing retrocardiac opacity suspicious for developing infection. The left costophrenic angle is cut off, and a left pleural effusion cannot be excluded. Repeat PA and lateral chest radiographs may be obtained as indicated." -Repeat CXR in 3 to 6 weeks and will need an Ambulatory Home O2 Screen prior to D/C and he did not desaturate   Uncontrolled HTN/Hypertensive Urgency improving -Checking Troponin and initial was 46 and trended down to 35 -Known history of hypertension has  not taken medication for several years and was on olmesartan previously but would not combine it due to lack of insurance and cost of  medication -He now has insurance and is planning to start taking medications -Was given Cozaar at the urgent care yesterday morning and took his first dose -Increase his Cozaar dose to 50 mg p.o. daily but now cardiology is changing to Entresto 24/26 mg p.o. twice daily and increasing to 49/51 mg twice daily and increasing carvedilol to 25 mg p.o. twice daily -He was given Cardizem and clonidine in the ED without significant improvement -He was added on as needed hydralazine as well as as needed labetalol -Cardiology's been consulted and his blood pressure was as high as 232/148 on admission -Initiated on amlodipine 10 mg p.o. daily cardiology has discontinued this; and will cardiology added carvedilol yesterday and increase the dose today -Patient was also placed on p.o. hydralazine which is now increased to 50 mg 3 times daily yesterday and increased to 75 mg p.o. today and cardiology added isosorbide 30 mg p.o. daily -Cardiology is recommending starting the patient on IV nitro drip given his systolic pressures in the 200s and given his elevated BNP and symptoms of CHF and also started IV Lasix 40 mg daily as well as spironolactone 25 mg p.o. daily; nitro drip has now stopped. -His nitro drip has now been discontinued and he can be transferred to [facility] -Cardiology feels that he has some untreated sleep apnea and recommending outpatient sleep study -Cardiology is also considering renal artery ultrasound and importance of cessation of tobacco and cocaine use -Cardiology feels he will need outpatient ischemic evaluation given his cardiomyopathy -Continue to monitor blood pressures per protocol; last blood pressure reading was 126/64 prior to discharge -Cardiology recommends titrating medications and outpatient further   Suspected Sleep Apnea -Has admitted to snoring and wife has described apneic episode -The patient will need outpatient sleep study   Polysubstance Abuse -Patient admitted  Daily marijuana use with heavy weekend ETOH/tobacco/cocaine use -Cessation encouraged; this should be encouraged on an ongoing basis; Patient admits to using Cocaine 2 weeks ago -UDS ordered   Tobacco Dependence -Encouraged cessation; this was discussed with the patient and should be reviewed on an ongoing basis.   -Patch ordered at patient request.   Prolonged QT -Likely assosciated with Infection from Multifocal PNA but could be related to drug use -Initial EKG showed a QTC of 618 ms -Avoid QT Prolonging medications including but not limited to PPI, Antiemetics, and SSRIs -Repeat EKG this AM has normalized to 470 on repeat -Correct electrolytes including potassium and magnesium; potassium was 3.4 yesterday and is now 4.1 today and magnesium is 2.0 -TSH was 3.474   Acute Combined Systolic and Diastolic CHF Suspected Hypertensive Cardiomyopathy and associated moderate pulm hypertension -No prior Baseline avilable but was 1,019.0 on Admisison -No overt signs of Volume overload but did have mild LE edema  -Given Cocaine Use, Prolonged QT, and Uncontrolled HTN, Echo has been ordered for further evaluation and Recommendations  -Cardiology consulted for further evaluation and management patient did endorse some CHF symptoms including dyspnea, orthopnea and PND as well as abdominal distention. -Cardiogram was done and showed an EF of 35 to 40% with global hypokinesis as well as grade 2 diastolic dysfunction -Cardiology feels that he will need outpatient ischemic evaluation and recommending continue nitro drip for blood pressure control and afterload reduction -Cardiology is continue diurese the patient with IV Lasix given his suspected hypertensive cardiomyopathy -They are recommending  continue amlodipine 10 mg p.o. daily and changing losartan to Entresto 24/26 mg twice daily and increase to 49/51 twice daily and will be discharged with this -They are also going to start hydralazine 25 mg 3 times  daily and uptitrate as necessary and increased to 50 mg 3 times daily yesterday and increased to 75 mg p.o. 3 times daily prior to discharge and cardiology also added isosorbide mononitrate 30 mg daily -We will continue spironolactone 12.5 mg p.o. daily and his carvedilol was started at 12.5 mg p.o. twice daily increase to 25 mg twice daily per cardiology -Cardiology has started an SGLT2 inhibitor with Farxiga 10 mg and he was given coupons for this daily-They are recommending strict I's and O's and daily weights -Cardiology suspect some hypertensive cardiomyopathy and initiated the patient on IV Lasix 40 mg p.o. daily and this was transitioned to p.o. 40 mg Lasix daily -They are recommending strict I's and O's and daily weights and continue to monitor his renal function carefully -Patient is -2.8245 L since admission -We will need an Ambulatory home O2 screen prior to discharge   Hypokalemia -Improved. K+ went from 3.4 -> 4.1 -> 3.1 and further improved to 3.6 again -Treat with p.o. KCl 40 mg twice daily x2 doses and cardiology will give an extra dose today -Continue to Monitor and Trend -Repeat CMP in the AM   Thrombocytopenia -Platelet count went from 156 and trended down to 131 but is now trended back up to 158 and further trended up to 203 -> 212 -Continue to monitor and trend and continue to monitor for signs and symptoms of bleeding; currently no overt bleeding noted -Repeat CBC in a.m.   Hyperbilirubinemia -Elevated and Likely reactive -T Bili as 2.2 (Direct was 0.6 and Indirect was 1.6) and is now improved to 0.6 -Continue to Monitor and Trend -Repeat CMP in the AM    Obesity -Complicates overall prognosis and care -Estimated body mass index is 36.93 kg/m as calculated from the following:   Height as of this encounter: 5\' 11"  (1.803 m).   Weight as of this encounter: 120.1 kg. -Weight Loss and Dietary Counseling given     Discharge Instructions  Discharge Instructions      Call MD for:  difficulty breathing, headache or visual disturbances   Complete by: As directed    Call MD for:  extreme fatigue   Complete by: As directed    Call MD for:  hives   Complete by: As directed    Call MD for:  persistant dizziness or light-headedness   Complete by: As directed    Call MD for:  persistant nausea and vomiting   Complete by: As directed    Call MD for:  redness, tenderness, or signs of infection (pain, swelling, redness, odor or green/yellow discharge around incision site)   Complete by: As directed    Call MD for:  severe uncontrolled pain   Complete by: As directed    Call MD for:  temperature >100.4   Complete by: As directed    Diet - low sodium heart healthy   Complete by: As directed    Discharge instructions   Complete by: As directed    You were cared for by a hospitalist during your hospital stay. If you have any questions about your discharge medications or the care you received while you were in the hospital after you are discharged, you can call the unit and ask to speak with the hospitalist on call  if the hospitalist that took care of you is not available. Once you are discharged, your primary care physician will handle any further medical issues. Please note that NO REFILLS for any discharge medications will be authorized once you are discharged, as it is imperative that you return to your primary care physician (or establish a relationship with a primary care physician if you do not have one) for your aftercare needs so that they can reassess your need for medications and monitor your lab values.  Follow up with PCP and Cardiology within 1-2 weeks. Take all medications as prescribed. If symptoms change or worsen please return to the ED for evaluation   Increase activity slowly   Complete by: As directed       Allergies as of 05/10/2021       Reactions   Apple Nausea And Vomiting        Medication List     STOP taking these  medications    losartan 25 MG tablet Commonly known as: COZAAR       TAKE these medications    acetaminophen 325 MG tablet Commonly known as: TYLENOL Take 2 tablets (650 mg total) by mouth every 6 (six) hours as needed for mild pain (or Fever >/= 101).   albuterol 108 (90 Base) MCG/ACT inhaler Commonly known as: VENTOLIN HFA Inhale 2 puffs into the lungs every 2 (two) hours as needed for wheezing or shortness of breath.   carvedilol 25 MG tablet Commonly known as: COREG Take 1 tablet (25 mg total) by mouth 2 (two) times daily with a meal.   cefdinir 300 MG capsule Commonly known as: OMNICEF Take 1 capsule (300 mg total) by mouth 2 (two) times daily for 3 days.   dapagliflozin propanediol 10 MG Tabs tablet Commonly known as: FARXIGA Take 1 tablet (10 mg total) by mouth daily. Start taking on: May 11, 2021   diphenhydrAMINE 25 mg capsule Commonly known as: BENADRYL Take 25 mg by mouth every 8 (eight) hours as needed for allergies.   doxycycline 100 MG tablet Commonly known as: VIBRA-TABS Take 1 tablet (100 mg total) by mouth 2 (two) times daily for 3 days.   furosemide 40 MG tablet Commonly known as: LASIX Take 1 tablet (40 mg total) by mouth daily. Start taking on: May 11, 2021   guaiFENesin 600 MG 12 hr tablet Commonly known as: MUCINEX Take 1 tablet (600 mg total) by mouth 2 (two) times daily as needed for up to 5 days for cough.   hydrALAZINE 25 MG tablet Commonly known as: APRESOLINE Take 3 tablets (75 mg total) by mouth 3 (three) times daily.   isosorbide mononitrate 30 MG 24 hr tablet Commonly known as: IMDUR Take 1 tablet (30 mg total) by mouth daily. Start taking on: May 11, 2021   sacubitril-valsartan 49-51 MG Commonly known as: ENTRESTO Take 1 tablet by mouth 2 (two) times daily.   spironolactone 25 MG tablet Commonly known as: ALDACTONE Take 1 tablet (25 mg total) by mouth daily. Start taking on: May 11, 2021   vitamin C  500 MG tablet Commonly known as: ASCORBIC ACID Take 500 mg by mouth daily.        Follow-up Information     Meriam Sprague, MD Follow up.   Specialties: Cardiology, Radiology Why: [facility] - cardiology office will call you arrange follow-up. Contact information: [address] [place] [address] [phone]  Allergies  Allergen Reactions   Apple Nausea And Vomiting   Consultations: Cardiology  Procedures/Studies: DG Chest 2 View  Result Date: 05/06/2021 CLINICAL DATA:  Shortness of breath, cough and chest tightness EXAM: CHEST - 2 VIEW COMPARISON:  01/28/2016 FINDINGS: Cardiomegaly. Mediastinal shadows are normal. There is central bronchial thickening. There is patchy perihilar density right more than left consistent with mild pneumonia. No lobar consolidation or collapse. No effusion. Bony structures unremarkable. IMPRESSION: Bronchitis.  Mild patchy perihilar pneumonia. Electronically Signed   By: Paulina Fusi M.D.   On: 05/06/2021 12:10   DG CHEST PORT 1 VIEW  Result Date: 05/09/2021 CLINICAL DATA:  Shortness of breath EXAM: PORTABLE CHEST 1 VIEW COMPARISON:  Chest radiograph 05/06/2021 FINDINGS: The heart is enlarged, unchanged. The left costophrenic angle is excluded from the field of view. There is increasing retrocardiac opacity which could reflect developing infection in the correct clinical setting. The left upper lung is well aerated. There is no focal consolidation on the right. There is no right effusion. There is no pneumothorax. The bones are stable. IMPRESSION: Increasing retrocardiac opacity suspicious for developing infection. The left costophrenic angle is cut off, and a left pleural effusion cannot be excluded. Repeat PA and lateral chest radiographs may be obtained as indicated. Electronically Signed   By: Lesia Hausen M.D.   On: 05/09/2021 08:03   ECHOCARDIOGRAM COMPLETE  Result Date: 05/07/2021     ECHOCARDIOGRAM REPORT   Patient Name:   Harold Bender Date of Exam: 05/07/2021 Medical Rec #:  [id number]    Height:       71.0 in Accession #:    [id number]   Weight:       264.8 lb Date of Birth:  Oct 09, 1990    BSA:          2.376 m Patient Age:    30 years     BP:           186/131 mmHg Patient Gender: M            HR:           110 bpm. Exam Location:  Inpatient Procedure: 2D Echo, Cardiac Doppler, Color Doppler and Intracardiac            Opacification Agent Indications:    HTN/SOB  History:        Patient has no prior history of Echocardiogram examinations.                 Risk Factors:Hypertension.  Sonographer:    Alvera Novel Referring Phys: [id number] JENNIFER YATES  Sonographer Comments: Patient is morbidly obese. Image acquisition challenging due to patient body habitus. IMPRESSIONS  1. Left ventricular ejection fraction, by estimation, is 35 to 40%. The left ventricle has moderately decreased function. The left ventricle demonstrates global hypokinesis. There is mild left ventricular hypertrophy. Left ventricular diastolic parameters are consistent with Grade II diastolic dysfunction (pseudonormalization).  2. Right ventricular systolic function is normal. The right ventricular size is normal. There is moderately elevated pulmonary artery systolic pressure. The estimated right ventricular systolic pressure is 47.7 mmHg.  3. Left atrial size was mild to moderately dilated.  4. Right atrial size was mildly dilated.  5. A small pericardial effusion is present.  6. The mitral valve is normal in structure. Trivial mitral valve regurgitation. No evidence of mitral stenosis.  7. The aortic valve is tricuspid. Aortic valve regurgitation is not visualized. Aortic valve sclerosis/calcification is present, without any evidence of aortic stenosis.  8. The inferior vena cava is normal in size with <50% respiratory variability, suggesting right atrial pressure of 8 mmHg. FINDINGS  Left Ventricle: Left ventricular ejection  fraction, by estimation, is 35 to 40%. The left ventricle has moderately decreased function. The left ventricle demonstrates global hypokinesis. Definity contrast agent was given IV to delineate the left ventricular endocardial borders. The left ventricular internal cavity size was normal in size. There is mild left ventricular hypertrophy. Left ventricular diastolic parameters are consistent with Grade II diastolic dysfunction (pseudonormalization). Right Ventricle: The right ventricular size is normal. No increase in right ventricular wall thickness. Right ventricular systolic function is normal. There is moderately elevated pulmonary artery systolic pressure. The tricuspid regurgitant velocity is 3.15 m/s, and with an assumed right atrial pressure of 8 mmHg, the estimated right ventricular systolic pressure is 47.7 mmHg. Left Atrium: Left atrial size was mild to moderately dilated. Right Atrium: Right atrial size was mildly dilated. Pericardium: A small pericardial effusion is present. Mitral Valve: The mitral valve is normal in structure. Trivial mitral valve regurgitation. No evidence of mitral valve stenosis. Tricuspid Valve: The tricuspid valve is normal in structure. Tricuspid valve regurgitation is trivial. Aortic Valve: The aortic valve is tricuspid. Aortic valve regurgitation is not visualized. Aortic valve sclerosis/calcification is present, without any evidence of aortic stenosis. Pulmonic Valve: The pulmonic valve was normal in structure. Pulmonic valve regurgitation is not visualized. Aorta: The aortic root is normal in size and structure. Venous: The inferior vena cava is normal in size with less than 50% respiratory variability, suggesting right atrial pressure of 8 mmHg. IAS/Shunts: No atrial level shunt detected by color flow Doppler.  LEFT VENTRICLE PLAX 2D LVIDd:         5.70 cm      Diastology LVIDs:         4.70 cm      LV e' medial:  8.92 cm/s LV PW:         1.35 cm      LV e' lateral: 14.90  cm/s LV IVS:        1.35 cm LVOT diam:     2.10 cm LV SV:         49 LV SV Index:   21 LVOT Area:     3.46 cm  LV Volumes (MOD) LV vol d, MOD A2C: 178.0 ml LV vol d, MOD A4C: 180.0 ml LV vol s, MOD A2C: 108.0 ml LV vol s, MOD A4C: 112.0 ml LV SV MOD A2C:     70.0 ml LV SV MOD A4C:     180.0 ml LV SV MOD BP:      67.5 ml RIGHT VENTRICLE             IVC RV S prime:     11.50 cm/s  IVC diam: 2.00 cm TAPSE (M-mode): 2.0 cm LEFT ATRIUM              Index        RIGHT ATRIUM           Index LA diam:        5.05 cm  2.13 cm/m   RA Area:     22.30 cm LA Vol (A2C):   122.0 ml 51.36 ml/m  RA Volume:   63.80 ml  26.86 ml/m LA Vol (A4C):   80.4 ml  33.84 ml/m LA Biplane Vol: 103.0 ml 43.36 ml/m  AORTIC VALVE LVOT Vmax:   96.70 cm/s LVOT Vmean:  68.900 cm/s  LVOT VTI:    0.141 m  AORTA Ao Root diam: 3.30 cm Ao Asc diam:  3.30 cm TRICUSPID VALVE TR Peak grad:   39.7 mmHg TR Vmax:        315.00 cm/s  SHUNTS Systemic VTI:  0.14 m Systemic Diam: 2.10 cm Dalton McleanMD Electronically signed by Wilfred Lacy Signature Date/Time: 05/07/2021/5:17:04 PM    Final     ECHOCARDIOGRAM  IMPRESSIONS     1. Left ventricular ejection fraction, by estimation, is 35 to 40%. The  left ventricle has moderately decreased function. The left ventricle  demonstrates global hypokinesis. There is mild left ventricular  hypertrophy. Left ventricular diastolic  parameters are consistent with Grade II diastolic dysfunction  (pseudonormalization).   2. Right ventricular systolic function is normal. The right ventricular  size is normal. There is moderately elevated pulmonary artery systolic  pressure. The estimated right ventricular systolic pressure is 47.7 mmHg.   3. Left atrial size was mild to moderately dilated.   4. Right atrial size was mildly dilated.   5. A small pericardial effusion is present.   6. The mitral valve is normal in structure. Trivial mitral valve  regurgitation. No evidence of mitral stenosis.   7. The  aortic valve is tricuspid. Aortic valve regurgitation is not  visualized. Aortic valve sclerosis/calcification is present, without any  evidence of aortic stenosis.   8. The inferior vena cava is normal in size with <50% respiratory  variability, suggesting right atrial pressure of 8 mmHg.   FINDINGS   Left Ventricle: Left ventricular ejection fraction, by estimation, is 35  to 40%. The left ventricle has moderately decreased function. The left  ventricle demonstrates global hypokinesis. Definity contrast agent was  given IV to delineate the left  ventricular endocardial borders. The left ventricular internal cavity size  was normal in size. There is mild left ventricular hypertrophy. Left  ventricular diastolic parameters are consistent with Grade II diastolic  dysfunction (pseudonormalization).   Right Ventricle: The right ventricular size is normal. No increase in  right ventricular wall thickness. Right ventricular systolic function is  normal. There is moderately elevated pulmonary artery systolic pressure.  The tricuspid regurgitant velocity is  3.15 m/s, and with an assumed right atrial pressure of 8 mmHg, the  estimated right ventricular systolic pressure is 47.7 mmHg.   Left Atrium: Left atrial size was mild to moderately dilated.   Right Atrium: Right atrial size was mildly dilated.   Pericardium: A small pericardial effusion is present.   Mitral Valve: The mitral valve is normal in structure. Trivial mitral  valve regurgitation. No evidence of mitral valve stenosis.   Tricuspid Valve: The tricuspid valve is normal in structure. Tricuspid  valve regurgitation is trivial.   Aortic Valve: The aortic valve is tricuspid. Aortic valve regurgitation is  not visualized. Aortic valve sclerosis/calcification is present, without  any evidence of aortic stenosis.   Pulmonic Valve: The pulmonic valve was normal in structure. Pulmonic valve  regurgitation is not visualized.    Aorta: The aortic root is normal in size and structure.   Venous: The inferior vena cava is normal in size with less than 50%  respiratory variability, suggesting right atrial pressure of 8 mmHg.   IAS/Shunts: No atrial level shunt detected by color flow Doppler.      LEFT VENTRICLE  PLAX 2D  LVIDd:         5.70 cm      Diastology  LVIDs:  4.70 cm      LV e' medial:  8.92 cm/s  LV PW:         1.35 cm      LV e' lateral: 14.90 cm/s  LV IVS:        1.35 cm  LVOT diam:     2.10 cm  LV SV:         49  LV SV Index:   21  LVOT Area:     3.46 cm     LV Volumes (MOD)  LV vol d, MOD A2C: 178.0 ml  LV vol d, MOD A4C: 180.0 ml  LV vol s, MOD A2C: 108.0 ml  LV vol s, MOD A4C: 112.0 ml  LV SV MOD A2C:     70.0 ml  LV SV MOD A4C:     180.0 ml  LV SV MOD BP:      67.5 ml   RIGHT VENTRICLE             IVC  RV S prime:     11.50 cm/s  IVC diam: 2.00 cm  TAPSE (M-mode): 2.0 cm   LEFT ATRIUM              Index        RIGHT ATRIUM           Index  LA diam:        5.05 cm  2.13 cm/m   RA Area:     22.30 cm  LA Vol (A2C):   122.0 ml 51.36 ml/m  RA Volume:   63.80 ml  26.86 ml/m  LA Vol (A4C):   80.4 ml  33.84 ml/m  LA Biplane Vol: 103.0 ml 43.36 ml/m   AORTIC VALVE  LVOT Vmax:   96.70 cm/s  LVOT Vmean:  68.900 cm/s  LVOT VTI:    0.141 m     AORTA  Ao Root diam: 3.30 cm  Ao Asc diam:  3.30 cm   TRICUSPID VALVE  TR Peak grad:   39.7 mmHg  TR Vmax:        315.00 cm/s     SHUNTS  Systemic VTI:  0.14 m  Systemic Diam: 2.10 cm     Subjective: Seen and examined and felt well and still had a slight cough but thinks it is improving.  No nausea or vomiting.  Blood pressure is much better controlled now.  Denies any lightheadedness or dizziness and is urinating well.  No other concerns or plans at this time.  Discharge Exam: Vitals:   05/10/21 1200 05/10/21 1248  BP: 126/64   Pulse: 92   Resp: (!) 35   Temp:  98.1 F (36.7 C)  SpO2: 100%    Vitals:   05/10/21 1000  05/10/21 1100 05/10/21 1200 05/10/21 1248  BP: (!) 138/91  126/64   Pulse: (!) 102 67 92   Resp: (!) 21 20 (!) 35   Temp:    98.1 F (36.7 C)  TempSrc:    Oral  SpO2: 93% 100% 100%   Weight:      Height:       General: Pt is alert, awake, not in acute distress Cardiovascular: RRR, S1/S2 +, no rubs, no gallops Respiratory: Diminished bilaterally, no wheezing, no rhonchi; unlabored breathing Abdominal: Soft, NT, distended secondary body habitus, bowel sounds + Extremities: 1+ edema, no cyanosis  The results of significant diagnostics from this hospitalization (including imaging, microbiology, ancillary and laboratory) are listed below for reference.  Microbiology: Recent Results (from the past 240 hour(s))  Culture, blood (Routine X 2) w Reflex to ID Panel     Status: None (Preliminary result)   Collection Time: 05/06/21  1:19 PM   Specimen: BLOOD LEFT HAND  Result Value Ref Range Status   Specimen Description   Final    BLOOD LEFT HAND Performed at [facility], [address] [place], [place] [address]    Special Requests   Final    BOTTLES DRAWN AEROBIC AND ANAEROBIC Blood Culture results may not be optimal due to an inadequate volume of blood received in culture bottles Performed at [facility], [address], [place], [place] [address]    Culture   Final    NO GROWTH 4 DAYS Performed at [facility], [address] [place], [place] [address]    Report Status PENDING  Incomplete  Culture, blood (Routine X 2) w Reflex to ID Panel     Status: None (Preliminary result)   Collection Time: 05/06/21  1:20 PM   Specimen: BLOOD  Result Value Ref Range Status   Specimen Description   Final    BLOOD LEFT ANTECUBITAL Performed at [facility], [address] [place], [place] [address]    Special Requests   Final    BOTTLES DRAWN AEROBIC AND ANAEROBIC Blood Culture adequate volume Performed at [facility], [address] [place], [place] [address]    Culture   Final    NO GROWTH 4 DAYS Performed at [facility], [address], [place], [place] [address]    Report Status PENDING  Incomplete  Resp Panel by RT-PCR (Flu A&B, Covid) Nasopharyngeal Swab     Status: None   Collection Time: 05/06/21  1:20 PM   Specimen: Nasopharyngeal Swab; Nasopharyngeal(NP) swabs in vial transport medium  Result Value Ref Range Status   SARS Coronavirus 2 by RT PCR NEGATIVE NEGATIVE Final    Comment: (NOTE) SARS-CoV-2 target nucleic acids are NOT DETECTED.  The SARS-CoV-2 RNA is generally detectable in upper respiratory specimens during the acute phase of infection. The lowest concentration of SARS-CoV-2 viral copies this assay can detect is 138 copies/mL. A negative result does not preclude SARS-Cov-2 infection and should not be used as the sole basis for treatment or other patient management decisions. A negative result may occur with  improper specimen collection/handling, submission of specimen other than nasopharyngeal swab, presence of viral mutation(s) within the areas targeted by this assay, and inadequate number of viral copies(<138 copies/mL). A negative result must be combined with clinical observations, patient history, and epidemiological information. The expected result is Negative.  Fact Sheet for Patients:  BloggerCourse.com  Fact Sheet for Healthcare Providers:  SeriousBroker.it  This test is no t yet approved or cleared by the [place] FDA and  has been authorized for detection and/or diagnosis of SARS-CoV-2 by FDA under an Emergency Use Authorization (EUA). This EUA will remain  in effect (meaning this test can be used) for the duration of the COVID-19 declaration under Section 564(b)(1) of the Act, 21 U.S.C.section 360bbb-3(b)(1), unless the authorization is terminated  or revoked sooner.        Influenza A by PCR NEGATIVE NEGATIVE Final   Influenza B by PCR NEGATIVE NEGATIVE Final    Comment: (NOTE) The Xpert Xpress SARS-CoV-2/FLU/RSV plus assay is intended as an aid in the diagnosis of influenza from Nasopharyngeal swab specimens and should not be used  as a sole basis for treatment. Nasal washings and aspirates are unacceptable for Xpert Xpress SARS-CoV-2/FLU/RSV testing.  Fact Sheet for Patients: BloggerCourse.com  Fact Sheet for Healthcare Providers: SeriousBroker.it  This test is not yet approved or cleared by the [place] FDA and has been authorized for detection and/or diagnosis of SARS-CoV-2 by FDA under an Emergency Use Authorization (EUA). This EUA will remain in effect (meaning this test can be used) for the duration of the COVID-19 declaration under Section 564(b)(1) of the Act, 21 U.S.C. section 360bbb-3(b)(1), unless the authorization is terminated or revoked.  Performed at [facility], [address] [place], [place] [address]   MRSA Next Gen by PCR, Nasal     Status: None   Collection Time: 05/06/21  6:03 PM   Specimen: Nasal Mucosa; Nasal Swab  Result Value Ref Range Status   MRSA by PCR Next Gen NOT DETECTED NOT DETECTED Final    Comment: (NOTE) The GeneXpert MRSA Assay (FDA approved for NASAL specimens only), is one component of a comprehensive MRSA colonization surveillance program. It is not intended to diagnose MRSA infection nor to guide or monitor treatment for MRSA infections. Test performance is not FDA approved in patients less than 45 years old. Performed at [facility], [address], [place], [place] [address]     Labs: BNP (last 3 results) Recent Labs    05/06/21 1154  BNP 1,019.0*   Basic Metabolic Panel: Recent Labs  Lab 05/06/21 1154 05/07/21 0246 05/07/21 0313 05/08/21 0259 05/09/21 0252 05/10/21 0306  NA 137  --  139 138  137 138  K 3.4*  --  4.1 3.1* 3.6 3.6  CL 107  --  108 107 108 108  CO2 21*  --  22 22 21* 22  GLUCOSE 127*  --  102* 107* 106* 112*  BUN 12  --  CREATININE 1.19  --  1.17 1.15 1.09 1.10  CALCIUM 8.7*  --  8.7* 8.6* 8.4* 8.6*  MG  --  2.0  --  1.9 1.7 2.2  PHOS  --  3.4  --  3.3 3.2 4.9*   Liver Function Tests: Recent Labs  Lab 05/07/21 0246 05/08/21 0259 05/09/21 0252 05/10/21 0306  AST 40 37 22 28  ALT ALKPHOS 53 47 45 53  BILITOT 2.2* 0.9 0.8 0.6  PROT 6.7 6.2* 6.2* 6.8  ALBUMIN 3.4* 3.1* 3.3* 3.4*   No results for input(s): LIPASE, AMYLASE in the last 168 hours. No results for input(s): AMMONIA in the last 168 hours. CBC: Recent Labs  Lab 05/06/21 1154 05/07/21 0313 05/08/21 0259 05/09/21 0252 05/10/21 0306  WBC 6.4 6.4 7.0 7.0 6.0  NEUTROABS 5.3  --  4.3 3.7 2.5  HGB 14.4 14.4 13.1 13.0 15.5  HCT 41.9 43.3 39.7 39.9 46.1  MCV 85.9 87.3 88.6 87.9 86.5  PLT 156 131* 158 203 212   Cardiac Enzymes: No results for input(s): CKTOTAL, CKMB, CKMBINDEX, TROPONINI in the last 168 hours. BNP: Invalid input(s): POCBNP CBG: Recent Labs  Lab 05/08/21 0544  GLUCAP 86   D-Dimer No results for input(s): DDIMER in the last 72 hours. Hgb A1c No results for input(s): HGBA1C in the last 72 hours. Lipid Profile No results for input(s): CHOL, HDL, LDLCALC, TRIG, CHOLHDL, LDLDIRECT in the last 72 hours. Thyroid function studies Recent Labs    05/08/21 0259  TSH 3.474   Anemia work up No results  for input(s): VITAMINB12, FOLATE, FERRITIN, TIBC, IRON, RETICCTPCT in the last 72 hours. Urinalysis No results found for: COLORURINE, APPEARANCEUR, LABSPEC, PHURINE, GLUCOSEU, HGBUR, BILIRUBINUR, KETONESUR, PROTEINUR, UROBILINOGEN, NITRITE, LEUKOCYTESUR Sepsis Labs Invalid input(s): PROCALCITONIN,  WBC,  LACTICIDVEN Microbiology Recent Results (from the past 240 hour(s))  Culture, blood (Routine X 2) w Reflex to ID Panel     Status: None (Preliminary  result)   Collection Time: 05/06/21  1:19 PM   Specimen: BLOOD LEFT HAND  Result Value Ref Range Status   Specimen Description   Final    BLOOD LEFT HAND Performed at [facility], [address] [place], [place] [address]    Special Requests   Final    BOTTLES DRAWN AEROBIC AND ANAEROBIC Blood Culture results may not be optimal due to an inadequate volume of blood received in culture bottles Performed at [facility] [address] [place], [place] [address]    Culture   Final    NO GROWTH 4 DAYS Performed at [facility], [address], [place], [place] [address]    Report Status PENDING  Incomplete  Culture, blood (Routine X 2) w Reflex to ID Panel     Status: None (Preliminary result)   Collection Time: 05/06/21  1:20 PM   Specimen: BLOOD  Result Value Ref Range Status   Specimen Description   Final    BLOOD LEFT ANTECUBITAL Performed at [facility], [address], [place], [place] [address]    Special Requests   Final    BOTTLES DRAWN AEROBIC AND ANAEROBIC Blood Culture adequate volume Performed at [facility], [address], [place], [place] [address]    Culture   Final    NO GROWTH 4 DAYS Performed at [facility], [address], [place], [place] [address]    Report Status PENDING  Incomplete  Resp Panel by RT-PCR (Flu A&B, Covid) Nasopharyngeal Swab     Status: None   Collection Time: 05/06/21  1:20 PM   Specimen: Nasopharyngeal Swab; Nasopharyngeal(NP) swabs in vial transport medium  Result Value Ref Range Status   SARS Coronavirus 2 by RT PCR NEGATIVE NEGATIVE Final    Comment: (NOTE) SARS-CoV-2 target nucleic acids are NOT DETECTED.  The SARS-CoV-2 RNA is generally detectable in upper respiratory specimens during the acute phase of infection. The lowest concentration of SARS-CoV-2 viral copies this assay can detect is 138 copies/mL. A negative result does not  preclude SARS-Cov-2 infection and should not be used as the sole basis for treatment or other patient management decisions. A negative result may occur with  improper specimen collection/handling, submission of specimen other than nasopharyngeal swab, presence of viral mutation(s) within the areas targeted by this assay, and inadequate number of viral copies(<138 copies/mL). A negative result must be combined with clinical observations, patient history, and epidemiological information. The expected result is Negative.  Fact Sheet for Patients:  BloggerCourse.com  Fact Sheet for Healthcare Providers:  SeriousBroker.it  This test is no t yet approved or cleared by the [place] FDA and  has been authorized for detection and/or diagnosis of SARS-CoV-2 by FDA under an Emergency Use Authorization (EUA). This EUA will remain  in effect (meaning this test can be used) for the duration of the COVID-19 declaration under Section 564(b)(1) of the Act, 21 U.S.C.section 360bbb-3(b)(1), unless the authorization is terminated  or revoked sooner.       Influenza A by PCR NEGATIVE NEGATIVE Final  Influenza B by PCR NEGATIVE NEGATIVE Final    Comment: (NOTE) The Xpert Xpress SARS-CoV-2/FLU/RSV plus assay is intended as an aid in the diagnosis of influenza from Nasopharyngeal swab specimens and should not be used as a sole basis for treatment. Nasal washings and aspirates are unacceptable for Xpert Xpress SARS-CoV-2/FLU/RSV testing.  Fact Sheet for Patients: BloggerCourse.com  Fact Sheet for Healthcare Providers: SeriousBroker.it  This test is not yet approved or cleared by the [place] FDA and has been authorized for detection and/or diagnosis of SARS-CoV-2 by FDA under an Emergency Use Authorization (EUA). This EUA will remain in effect (meaning this test can be used) for the  duration of the COVID-19 declaration under Section 564(b)(1) of the Act, 21 U.S.C. section 360bbb-3(b)(1), unless the authorization is terminated or revoked.  Performed at [facility], [address], [place], [place] [address]   MRSA Next Gen by PCR, Nasal     Status: None   Collection Time: 05/06/21  6:03 PM   Specimen: Nasal Mucosa; Nasal Swab  Result Value Ref Range Status   MRSA by PCR Next Gen NOT DETECTED NOT DETECTED Final    Comment: (NOTE) The GeneXpert MRSA Assay (FDA approved for NASAL specimens only), is one component of a comprehensive MRSA colonization surveillance program. It is not intended to diagnose MRSA infection nor to guide or monitor treatment for MRSA infections. Test performance is not FDA approved in patients less than 51 years old. Performed at [facility], [address] [place], [place] [address]    Time coordinating discharge: 35 minutes  SIGNED:  Merlene Laughter, DO [facility] 05/10/2021, 6:58 PM Pager is on AMION  If 7PM-7AM, please contact night-coverage [email or web address]

## 2021-05-11 LAB — CULTURE, BLOOD (ROUTINE X 2)
Culture: NO GROWTH
Culture: NO GROWTH
Special Requests: ADEQUATE

## 2021-05-19 NOTE — Progress Notes (Signed)
Cardiology Office Note    Date:  05/20/2021   ID:  Harold Bender, DOB 09/27/1990, MRN [id number]   PCP:  Patient, No Pcp Per (Inactive)   [facility]  Cardiologist:  Meriam Sprague, MD   Advanced Practice Provider:  No care team member to display Electrophysiologist:  None   [id number]   No chief complaint on file.   History of Present Illness:  Harold Bender is a 30 y.o. male  w/ hx HTN, polysubs abuse was admitted 11/15 with cough, SOB, abd tightness, HTN emergency.   Patient seen in the hospital for HTN emergency, CHF, elevated troponin.  Echocardiogram 05/07/21 showed ejection fraction 35 to 40%, mild left ventricular hypertrophy, grade 2 diastolic dysfunction, mild to moderate left atrial enlargement, mild right atrial enlargement, small pericardial effusion. Patient had transient mobitz 1 second degree AV block and transient junctional rhythm. Suspected sleep apnea.  Patient comes in for f/u. Just got [place] and entresto filled today so this am was first dose. Can't afford farxiga-$400/month. Feeling better than he has in a long time. Walking on treadmill 3 days/week. Doesn't have a BP cuff that fits. Watching diet. Works for [employer] from home. Quit smoking, no drugs and an occasional drink. Has lost 10 lbs.     Past Medical History:  Diagnosis Date   Hypertension     Past Surgical History:  Procedure Laterality Date   ANKLE SURGERY Left     Current Medications: Current Meds  Medication Sig   acetaminophen (TYLENOL) 325 MG tablet Take 2 tablets (650 mg total) by mouth every 6 (six) hours as needed for mild pain (or Fever >/= 101).   albuterol (VENTOLIN HFA) 108 (90 Base) MCG/ACT inhaler Inhale 2 puffs into the lungs every 2 (two) hours as needed for wheezing or shortness of breath.   carvedilol (COREG) 25 MG tablet Take 1 tablet (25 mg total) by mouth 2 (two) times daily with a meal.   dapagliflozin propanediol (FARXIGA) 10 MG TABS tablet  Take 1 tablet (10 mg total) by mouth daily.   diphenhydrAMINE (BENADRYL) 25 mg capsule Take 25 mg by mouth every 8 (eight) hours as needed for allergies.   furosemide (LASIX) 40 MG tablet Take 1 tablet (40 mg total) by mouth daily.   hydrALAZINE (APRESOLINE) 25 MG tablet Take 3 tablets (75 mg total) by mouth 3 (three) times daily.   isosorbide mononitrate (IMDUR) 30 MG 24 hr tablet Take 1 tablet (30 mg total) by mouth daily.   sacubitril-valsartan (ENTRESTO) 49-51 MG Take 1 tablet by mouth 2 (two) times daily.   vitamin C (ASCORBIC ACID) 500 MG tablet Take 500 mg by mouth daily.   [DISCONTINUED] spironolactone (ALDACTONE) 25 MG tablet Take 1 tablet (25 mg total) by mouth daily.     Allergies:   Apple   Social History   Socioeconomic History   Marital status: Single    Spouse name: Not on file   Number of children: Not on file   Years of education: Not on file   Highest education level: Not on file  Occupational History   Occupation: insurance  Tobacco Use   Smoking status: Former    Types: Cigarettes   Smokeless tobacco: Never   Tobacco comments:    With alcohol  Substance and Sexual Activity   Alcohol use: Yes    Comment: binge drinking - 7-10 drinks on weekned nights   Drug use: Yes    Types: Marijuana, Cocaine  Comment: daily marijuana use; last used cocaine 2 weeks ago   Sexual activity: Yes    Birth control/protection: None  Other Topics Concern   Not on file  Social History Narrative   Not on file   Social Determinants of Health   Financial Resource Strain: Not on file  Food Insecurity: Not on file  Transportation Needs: Not on file  Physical Activity: Not on file  Stress: Not on file  Social Connections: Not on file     Family History:  The patient's  family history is not on file.   ROS:   Please see the history of present illness.    ROS All other systems reviewed and are negative.   PHYSICAL EXAM:   VS:  BP (!) 140/110   Pulse 100   Ht 5\' 11"   (1.803 m)   Wt 266 lb 9.6 oz (120.9 kg)   SpO2 98%   BMI 37.18 kg/m   Physical Exam  GEN: Obese, in no acute distress  Neck: no JVD, carotid bruits, or masses Cardiac:RRR; S4 no murmurs, rubs  Respiratory:  clear to auscultation bilaterally, normal work of breathing GI: soft, nontender, nondistended, + BS Ext: without cyanosis, clubbing, or edema, Good distal pulses bilaterally Neuro:  Alert and Oriented x 3, Psych: euthymic mood, full affect  Wt Readings from Last 3 Encounters:  05/20/21 266 lb 9.6 oz (120.9 kg)  05/06/21 264 lb 12.4 oz (120.1 kg)      Studies/Labs Reviewed:   EKG:  EKG is not ordered today.   Recent Labs: 05/06/2021: B Natriuretic Peptide 1,019.0 05/08/2021: TSH 3.474 05/10/2021: ALT 25; BUN 12; Creatinine, Ser 1.10; Hemoglobin 15.5; Magnesium 2.2; Platelets 212; Potassium 3.6; Sodium 138   Lipid Panel No results found for: CHOL, TRIG, HDL, CHOLHDL, VLDL, LDLCALC, LDLDIRECT  Additional studies/ records that were reviewed today include:  2D echo 05/07/2021 IMPRESSIONS     1. Left ventricular ejection fraction, by estimation, is 35 to 40%. The  left ventricle has moderately decreased function. The left ventricle  demonstrates global hypokinesis. There is mild left ventricular  hypertrophy. Left ventricular diastolic  parameters are consistent with Grade II diastolic dysfunction  (pseudonormalization).   2. Right ventricular systolic function is normal. The right ventricular  size is normal. There is moderately elevated pulmonary artery systolic  pressure. The estimated right ventricular systolic pressure is 47.7 mmHg.   3. Left atrial size was mild to moderately dilated.   4. Right atrial size was mildly dilated.   5. A small pericardial effusion is present.   6. The mitral valve is normal in structure. Trivial mitral valve  regurgitation. No evidence of mitral stenosis.   7. The aortic valve is tricuspid. Aortic valve regurgitation is not   visualized. Aortic valve sclerosis/calcification is present, without any  evidence of aortic stenosis.   8. The inferior vena cava is normal in size with <50% respiratory  variability, suggesting right atrial pressure of 8 mmHg.    Risk Assessment/Calculations:         ASSESSMENT:    1. NICM (nonischemic cardiomyopathy) (HCC)   2. Chronic combined systolic and diastolic CHF (congestive heart failure) (HCC)   3. Uncontrolled hypertension   4. Suspected sleep apnea   5. Polysubstance abuse (HCC)      PLAN:  In order of problems listed above:  Cardiomyopathy-felt likely to be secondary to uncontrolled hypertension.  On coreg (transient bradycardia noted on telemetry but patient with history of snoring and sleep apnea likely  contributing).  hydralazine  75 mg p.o. 3 times daily and isosorbide 30 mg daily.  Patient just filled his Sherryll Burger and [place] today because he could not afford it.  [name] cost him about $400 a month but Sherryll Burger is $10 co-pay.  Blood pressure running high today.  Will increase spironolactone to 50 mg once daily.  He is going get a blood pressure cuff that fits to keep track of his blood pressure.  We will see what is doing on current dose Entresto.  We will see if he can get patient assistance on Farxiga.  Once fully titrated would repeat echocardiogram in 3 months later.  If LV function remains decreased would need ischemia evaluation at that point.   Combined systolic/diastolic congestive heart failure-compensated.     Hypertensive emergency-blood pressure still running high.  Patient will monitor blood pressure at home now that he started [facility]. F/u in 2 weeks with BMET to see if we can titrate up.    Obstructive sleep apnea-we will need outpatient sleep evaluation. He wants to hold off since he lost weight and his wife says he's not snoring.   Polysubstance abuse-patient quit smoking/drugs. Still drinking some.        Shared Decision Making/Informed  Consent        Medication Adjustments/Labs and Tests Ordered: Current medicines are reviewed at length with the patient today.  Concerns regarding medicines are outlined above.  Medication changes, Labs and Tests ordered today are listed in the Patient Instructions below. Patient Instructions  Medication Instructions:  Your physician has recommended you make the following change in your medication:   INCREASE: Spironolactone  daily  *If you need a refill on your cardiac medications before your next appointment, please call your pharmacy*   Lab Work: BMET on 12/15  If you have labs (blood work) drawn today and your tests are completely normal, you will receive your results only by: MyChart Message (if you have MyChart) OR A paper copy in the mail If you have any lab test that is abnormal or we need to change your treatment, we will call you to review the results.   Follow-Up: At [facility], you and your health needs are our priority.  As part of our continuing mission to provide you with exceptional heart care, we have created designated Provider Care Teams.  These Care Teams include your primary Cardiologist (physician) and Advanced Practice Providers (APPs -  Physician Assistants and Nurse Practitioners) who all work together to provide you with the care you need, when you need it.  We recommend signing up for the patient portal called "MyChart".  Sign up information is provided on this After Visit Summary.  MyChart is used to connect with patients for Virtual Visits (Telemedicine).  Patients are able to view lab/test results, encounter notes, upcoming appointments, etc.  Non-urgent messages can be sent to your provider as well.   To learn more about what you can do with MyChart, go to [email or web address].    Your next appointment:   12/15 with Dr Shari Prows  Two Gram Sodium Diet 2000 mg  What is Sodium? Sodium is a mineral found naturally in many foods. The most  significant source of sodium in the diet is table salt, which is about 40% sodium.  Processed, convenience, and preserved foods also contain a large amount of sodium.  The body needs only 500 mg of sodium daily to function,  A normal diet provides more than enough sodium even if you do not  use salt.  Why Limit Sodium? A build up of sodium in the body can cause thirst, increased blood pressure, shortness of breath, and water retention.  Decreasing sodium in the diet can reduce edema and risk of heart attack or stroke associated with high blood pressure.  Keep in mind that there are many other factors involved in these health problems.  Heredity, obesity, lack of exercise, cigarette smoking, stress and what you eat all play a role.  General Guidelines: Do not add salt at the table or in cooking.  One teaspoon of salt contains over 2 grams of sodium. Read food labels Avoid processed and convenience foods Ask your dietitian before eating any foods not dicussed in the menu planning guidelines Consult your physician if you wish to use a salt substitute or a sodium containing medication such as antacids.  Limit milk and milk products to 16 oz (2 cups) per day.  Shopping Hints: READ LABELS!! "Dietetic" does not necessarily mean low sodium. Salt and other sodium ingredients are often added to foods during processing.    Menu Planning Guidelines Food Group Choose More Often Avoid  Beverages (see also the milk group All fruit juices, low-sodium, salt-free vegetables juices, low-sodium carbonated beverages Regular vegetable or tomato juices, commercially softened water used for drinking or cooking  Breads and Cereals Enriched white, wheat, rye and pumpernickel bread, hard rolls and dinner rolls; muffins, cornbread and waffles; most dry cereals, cooked cereal without added salt; unsalted crackers and breadsticks; low sodium or homemade bread crumbs Bread, rolls and crackers with salted tops; quick breads;  instant hot cereals; pancakes; commercial bread stuffing; self-rising flower and biscuit mixes; regular bread crumbs or cracker crumbs  Desserts and Sweets Desserts and sweets mad with mild should be within allowance Instant pudding mixes and cake mixes  Fats Butter or margarine; vegetable oils; unsalted salad dressings, regular salad dressings limited to 1 Tbs; light, sour and heavy cream Regular salad dressings containing bacon fat, bacon bits, and salt pork; snack dips made with instant soup mixes or processed cheese; salted nuts  Fruits Most fresh, frozen and canned fruits Fruits processed with salt or sodium-containing ingredient (some dried fruits are processed with sodium sulfites        Vegetables Fresh, frozen vegetables and low- sodium canned vegetables Regular canned vegetables, sauerkraut, pickled vegetables, and others prepared in brine; frozen vegetables in sauces; vegetables seasoned with ham, bacon or salt pork  Condiments, Sauces, Miscellaneous  Salt substitute with physician's approval; pepper, herbs, spices; vinegar, lemon or lime juice; hot pepper sauce; garlic powder, onion powder, low sodium soy sauce (1 Tbs.); low sodium condiments (ketchup, chili sauce, mustard) in limited amounts (1 tsp.) fresh ground horseradish; unsalted tortilla chips, pretzels, potato chips, popcorn, salsa (1/4 cup) Any seasoning made with salt including garlic salt, celery salt, onion salt, and seasoned salt; sea salt, rock salt, kosher salt; meat tenderizers; monosodium glutamate; mustard, regular soy sauce, barbecue, sauce, chili sauce, teriyaki sauce, steak sauce, Worcestershire sauce, and most flavored vinegars; canned gravy and mixes; regular condiments; salted snack foods, olives, picles, relish, horseradish sauce, catsup   Food preparation: Try these seasonings Meats:    Pork Sage, onion Serve with applesauce  Chicken Poultry seasoning, thyme, parsley Serve with cranberry sauce  Lamb Curry  powder, rosemary, garlic, thyme Serve with mint sauce or jelly  Veal Marjoram, basil Serve with current jelly, cranberry sauce  Beef Pepper, bay leaf Serve with dry mustard, unsalted chive butter  Fish [place] leaf, dill Serve with  unsalted lemon butter, unsalted parsley butter  Vegetables:    Asparagus Lemon juice   Broccoli Lemon juice   Carrots Mustard dressing parsley, mint, nutmeg, glazed with unsalted butter and sugar   Green beans Marjoram, lemon juice, nutmeg,dill seed   Tomatoes Basil, marjoram, onion   Spice /blend for [employer]" 4 tsp ground thyme 1 tsp ground sage 3 tsp ground rosemary 4 tsp ground marjoram   Test your knowledge A product that says "Salt Free" may still contain sodium. True or False Garlic Powder and Hot Pepper Sauce an be used as alternative seasonings.True or False Processed foods have more sodium than fresh foods.  True or False Canned Vegetables have less sodium than froze True or False   WAYS TO DECREASE YOUR SODIUM INTAKE Avoid the use of added salt in cooking and at the table.  Table salt (and other prepared seasonings which contain salt) is probably one of the greatest sources of sodium in the diet.  Unsalted foods can gain flavor from the sweet, sour, and butter taste sensations of herbs and spices.  Instead of using salt for seasoning, try the following seasonings with the foods listed.  Remember: how you use them to enhance natural food flavors is limited only by your creativity... Allspice-Meat, fish, eggs, fruit, peas, red and yellow vegetables Almond Extract-Fruit baked goods Anise Seed-Sweet breads, fruit, carrots, beets, cottage cheese, cookies (tastes like licorice) Basil-Meat, fish, eggs, vegetables, rice, vegetables salads, soups, sauces Bay Leaf-Meat, fish, stews, poultry Burnet-Salad, vegetables (cucumber-like flavor) Caraway Seed-Bread, cookies, cottage cheese, meat, vegetables, cheese, rice Cardamon-Baked goods, fruit, soups Celery  Powder or seed-Salads, salad dressings, sauces, meatloaf, soup, bread.Do not use  celery salt Chervil-Meats, salads, fish, eggs, vegetables, cottage cheese (parsley-like flavor) Chili Power-Meatloaf, chicken cheese, corn, eggplant, egg dishes Chives-Salads cottage cheese, egg dishes, soups, vegetables, sauces Cilantro-Salsa, casseroles Cinnamon-Baked goods, fruit, pork, lamb, chicken, carrots Cloves-Fruit, baked goods, fish, pot roast, green beans, beets, carrots Coriander-Pastry, cookies, meat, salads, cheese (lemon-orange flavor) Cumin-Meatloaf, fish,cheese, eggs, cabbage,fruit pie (caraway flavor) [employer], fruit, eggs, fish, poultry, cottage cheese, vegetables Dill Seed-Meat, cottage cheese, poultry, vegetables, fish, salads, bread Fennel Seed-Bread, cookies, apples, pork, eggs, fish, beets, cabbage, cheese, Licorice-like flavor Garlic-(buds or powder) Salads, meat, poultry, fish, bread, butter, vegetables, potatoes.Do not  use garlic salt Ginger-Fruit, vegetables, baked goods, meat, fish, poultry Horseradish Root-Meet, vegetables, butter Lemon Juice or Extract-Vegetables, fruit, tea, baked goods, fish salads Mace-Baked goods fruit, vegetables, fish, poultry (taste like nutmeg) Maple Extract-Syrups Marjoram-Meat, chicken, fish, vegetables, breads, green salads (taste like Sage) Mint-Tea, lamb, sherbet, vegetables, desserts, carrots, cabbage Mustard, Dry or Seed-Cheese, eggs, meats, vegetables, poultry Nutmeg-Baked goods, fruit, chicken, eggs, vegetables, desserts Onion Powder-Meat, fish, poultry, vegetables, cheese, eggs, bread, rice salads (Do not use   Onion salt) Orange Extract-Desserts, baked goods Oregano-Pasta, eggs, cheese, onions, pork, lamb, fish, chicken, vegetables, green salads Paprika-Meat, fish, poultry, eggs, cheese, vegetables Parsley Flakes-Butter, vegetables, meat fish, poultry, eggs, bread, salads (certain forms may   Contain sodium Pepper-Meat fish,  poultry, vegetables, eggs Peppermint Extract-Desserts, baked goods Poppy Seed-Eggs, bread, cheese, fruit dressings, baked goods, noodles, vegetables, cottage  [employer], poultry, meat, fish, cauliflower, turnips,eggs bread Saffron-Rice, bread, veal, chicken, fish, eggs Sage-Meat, fish, poultry, onions, eggplant, tomateos, pork, stews Savory-Eggs, salads, poultry, meat, rice, vegetables, soups, pork Tarragon-Meat, poultry, fish, eggs, butter, vegetables (licorice-like flavor)  Thyme-Meat, poultry, fish, eggs, vegetables, (clover-like flavor), sauces, soups Tumeric-Salads, butter, eggs, fish, rice, vegetables (saffron-like flavor) Vanilla Extract-Baked goods, candy Vinegar-Salads, vegetables, meat marinades Walnut  Extract-baked goods, candy   2. Choose your Foods Wisely   The following is a list of foods to avoid which are high in sodium:  Meats-Avoid all smoked, canned, salt cured, dried and kosher meat and fish as well as Anchovies   [employer] meats:Bologna, Liverwurst, Pastrami Canned meat or fish  Marinated herring Caviar    Pepperoni Corned Beef   Pizza Dried chipped beef  Salami Frozen breaded fish or meat Salt pork Frankfurters or hot dogs  Sardines Gefilte fish   Sausage Ham (boiled ham, Proscuitto Smoked butt    spiced ham)   Spam      TV Dinners Vegetables Canned vegetables (Regular) Relish Canned mushrooms  Sauerkraut Olives    Tomato juice Pickles  Bakery and Dessert Products Canned puddings  Cream pies Cheesecake   Decorated cakes Cookies  Beverages/Juices Tomato juice, regular  Gatorade   V-8 vegetable juice, regular  Breads and Cereals Biscuit mixes   Salted potato chips, corn chips, pretzels Bread stuffing mixes  Salted crackers and rolls Pancake and waffle mixes Self-rising flour  Seasonings Accent    Meat sauces Barbecue sauce  Meat tenderizer Catsup    Monosodium glutamate (MSG) Celery  salt   Onion salt Chili sauce   Prepared mustard Garlic salt   Salt, seasoned salt, sea salt Gravy mixes   Soy sauce Horseradish   Steak sauce Ketchup   Tartar sauce Lite salt    Teriyaki sauce Marinade mixes   Worcestershire sauce  Others Baking powder   Cocoa and cocoa mixes Baking soda   Commercial casserole mixes Candy-caramels, chocolate  Dehydrated soups    Bars, fudge,nougats  Instant rice and pasta mixes Canned broth or soup  Maraschino cherries Cheese, aged and processed cheese and cheese spreads  Learning Assessment Quiz  Indicated T (for True) or F (for False) for each of the following statements:  _____ Fresh fruits and vegetables and unprocessed grains are generally low in sodium _____ Water may contain a considerable amount of sodium, depending on the source _____ You can always tell if a food is high in sodium by tasting it _____ Certain laxatives my be high in sodium and should be avoided unless prescribed   by a physician or pharmacist _____ Salt substitutes may be used freely by anyone on a sodium restricted diet _____ Sodium is present in table salt, food additives and as a natural component of   most foods _____ Table salt is approximately 90% sodium _____ Limiting sodium intake may help prevent excess fluid accumulation in the body _____ On a sodium-restricted diet, seasonings such as bouillon soy sauce, and    cooking wine should be used in place of table salt _____ On an ingredient list, a product which lists monosodium glutamate as the first   ingredient is an appropriate food to include on a low sodium diet  Circle the best answer(s) to the following statements (Hint: there may be more than one correct answer)  11. On a low-sodium diet, some acceptable snack items are:    A. Olives  F. Bean dip   K. Grapefruit juice    B. Salted Pretzels G. Commercial Popcorn   L. Canned peaches    C. Carrot Sticks  H. Bouillon   M. Unsalted nuts   D. [place]  fries  I. Peanut butter crackers N. Salami   E. Sweet pickles J. Tomato Juice   O. Pizza  12.  Seasonings that may be used freely  on a reduced - sodium diet include   A. Lemon wedges F.Monosodium glutamate K. Celery seed    B.Soysauce   G. Pepper   L. Mustard powder   C. Sea salt  H. Cooking wine  M. Onion flakes   D. Vinegar  E. Prepared horseradish N. Salsa   E. Sage   J. Worcestershire sauce  O. [address]   Elson Clan, PA-C  05/20/2021 12:19 PM    [facility] [address] [place], [place], [place]  [address] Phone: [phone]; Fax: [phone]

## 2021-05-20 ENCOUNTER — Other Ambulatory Visit: Payer: Self-pay

## 2021-05-20 ENCOUNTER — Encounter: Payer: Self-pay | Admitting: Physician Assistant

## 2021-05-20 ENCOUNTER — Ambulatory Visit (INDEPENDENT_AMBULATORY_CARE_PROVIDER_SITE_OTHER): Payer: BC Managed Care – PPO | Admitting: Physician Assistant

## 2021-05-20 VITALS — BP 140/110 | HR 100 | Ht 71.0 in | Wt 266.6 lb

## 2021-05-20 DIAGNOSIS — F1910 Other psychoactive substance abuse, uncomplicated: Secondary | ICD-10-CM

## 2021-05-20 DIAGNOSIS — R29818 Other symptoms and signs involving the nervous system: Secondary | ICD-10-CM | POA: Diagnosis not present

## 2021-05-20 DIAGNOSIS — I10 Essential (primary) hypertension: Secondary | ICD-10-CM | POA: Diagnosis not present

## 2021-05-20 DIAGNOSIS — I5042 Chronic combined systolic (congestive) and diastolic (congestive) heart failure: Secondary | ICD-10-CM

## 2021-05-20 DIAGNOSIS — I428 Other cardiomyopathies: Secondary | ICD-10-CM | POA: Diagnosis not present

## 2021-05-20 MED ORDER — SPIRONOLACTONE 50 MG PO TABS: 50.0000 mg | ORAL_TABLET | Freq: Every day | ORAL | 3 refills | Status: DC

## 2021-05-20 NOTE — Patient Instructions (Addendum)
Medication Instructions:  Your physician has recommended you make the following change in your medication:   INCREASE: Spironolactone 50mg  daily  *If you need a refill on your cardiac medications before your next appointment, please call your pharmacy*   Lab Work: BMET on 12/15  If you have labs (blood work) drawn today and your tests are completely normal, you will receive your results only by: MyChart Message (if you have MyChart) OR A paper copy in the mail If you have any lab test that is abnormal or we need to change your treatment, we will call you to review the results.   Follow-Up: At [facility], you and your health needs are our priority.  As part of our continuing mission to provide you with exceptional heart care, we have created designated Provider Care Teams.  These Care Teams include your primary Cardiologist (physician) and Advanced Practice Providers (APPs -  Physician Assistants and Nurse Practitioners) who all work together to provide you with the care you need, when you need it.  We recommend signing up for the patient portal called "MyChart".  Sign up information is provided on this After Visit Summary.  MyChart is used to connect with patients for Virtual Visits (Telemedicine).  Patients are able to view lab/test results, encounter notes, upcoming appointments, etc.  Non-urgent messages can be sent to your provider as well.   To learn more about what you can do with MyChart, go to [email or web address].    Your next appointment:   12/15 with Dr Shari Prows  Two Gram Sodium Diet 2000 mg  What is Sodium? Sodium is a mineral found naturally in many foods. The most significant source of sodium in the diet is table salt, which is about 40% sodium.  Processed, convenience, and preserved foods also contain a large amount of sodium.  The body needs only 500 mg of sodium daily to function,  A normal diet provides more than enough sodium even if you do not use salt.  Why  Limit Sodium? A build up of sodium in the body can cause thirst, increased blood pressure, shortness of breath, and water retention.  Decreasing sodium in the diet can reduce edema and risk of heart attack or stroke associated with high blood pressure.  Keep in mind that there are many other factors involved in these health problems.  Heredity, obesity, lack of exercise, cigarette smoking, stress and what you eat all play a role.  General Guidelines: Do not add salt at the table or in cooking.  One teaspoon of salt contains over 2 grams of sodium. Read food labels Avoid processed and convenience foods Ask your dietitian before eating any foods not dicussed in the menu planning guidelines Consult your physician if you wish to use a salt substitute or a sodium containing medication such as antacids.  Limit milk and milk products to 16 oz (2 cups) per day.  Shopping Hints: READ LABELS!! "Dietetic" does not necessarily mean low sodium. Salt and other sodium ingredients are often added to foods during processing.    Menu Planning Guidelines Food Group Choose More Often Avoid  Beverages (see also the milk group All fruit juices, low-sodium, salt-free vegetables juices, low-sodium carbonated beverages Regular vegetable or tomato juices, commercially softened water used for drinking or cooking  Breads and Cereals Enriched white, wheat, rye and pumpernickel bread, hard rolls and dinner rolls; muffins, cornbread and waffles; most dry cereals, cooked cereal without added salt; unsalted crackers and breadsticks; low sodium or homemade bread crumbs  Bread, rolls and crackers with salted tops; quick breads; instant hot cereals; pancakes; commercial bread stuffing; self-rising flower and biscuit mixes; regular bread crumbs or cracker crumbs  Desserts and Sweets Desserts and sweets mad with mild should be within allowance Instant pudding mixes and cake mixes  Fats Butter or margarine; vegetable oils; unsalted  salad dressings, regular salad dressings limited to 1 Tbs; light, sour and heavy cream Regular salad dressings containing bacon fat, bacon bits, and salt pork; snack dips made with instant soup mixes or processed cheese; salted nuts  Fruits Most fresh, frozen and canned fruits Fruits processed with salt or sodium-containing ingredient (some dried fruits are processed with sodium sulfites        Vegetables Fresh, frozen vegetables and low- sodium canned vegetables Regular canned vegetables, sauerkraut, pickled vegetables, and others prepared in brine; frozen vegetables in sauces; vegetables seasoned with ham, bacon or salt pork  Condiments, Sauces, Miscellaneous  Salt substitute with physician's approval; pepper, herbs, spices; vinegar, lemon or lime juice; hot pepper sauce; garlic powder, onion powder, low sodium soy sauce (1 Tbs.); low sodium condiments (ketchup, chili sauce, mustard) in limited amounts (1 tsp.) fresh ground horseradish; unsalted tortilla chips, pretzels, potato chips, popcorn, salsa (1/4 cup) Any seasoning made with salt including garlic salt, celery salt, onion salt, and seasoned salt; sea salt, rock salt, kosher salt; meat tenderizers; monosodium glutamate; mustard, regular soy sauce, barbecue, sauce, chili sauce, teriyaki sauce, steak sauce, Worcestershire sauce, and most flavored vinegars; canned gravy and mixes; regular condiments; salted snack foods, olives, picles, relish, horseradish sauce, catsup   Food preparation: Try these seasonings Meats:    Pork Sage, onion Serve with applesauce  Chicken Poultry seasoning, thyme, parsley Serve with cranberry sauce  Lamb Curry powder, rosemary, garlic, thyme Serve with mint sauce or jelly  Veal Marjoram, basil Serve with current jelly, cranberry sauce  Beef Pepper, bay leaf Serve with dry mustard, unsalted chive butter  Fish [place] leaf, dill Serve with unsalted lemon butter, unsalted parsley butter  Vegetables:    Asparagus Lemon  juice   Broccoli Lemon juice   Carrots Mustard dressing parsley, mint, nutmeg, glazed with unsalted butter and sugar   Green beans Marjoram, lemon juice, nutmeg,dill seed   Tomatoes Basil, marjoram, onion   Spice /blend for [employer]" 4 tsp ground thyme 1 tsp ground sage 3 tsp ground rosemary 4 tsp ground marjoram   Test your knowledge A product that says "Salt Free" may still contain sodium. True or False Garlic Powder and Hot Pepper Sauce an be used as alternative seasonings.True or False Processed foods have more sodium than fresh foods.  True or False Canned Vegetables have less sodium than froze True or False   WAYS TO DECREASE YOUR SODIUM INTAKE Avoid the use of added salt in cooking and at the table.  Table salt (and other prepared seasonings which contain salt) is probably one of the greatest sources of sodium in the diet.  Unsalted foods can gain flavor from the sweet, sour, and butter taste sensations of herbs and spices.  Instead of using salt for seasoning, try the following seasonings with the foods listed.  Remember: how you use them to enhance natural food flavors is limited only by your creativity... Allspice-Meat, fish, eggs, fruit, peas, red and yellow vegetables Almond Extract-Fruit baked goods Anise Seed-Sweet breads, fruit, carrots, beets, cottage cheese, cookies (tastes like licorice) Basil-Meat, fish, eggs, vegetables, rice, vegetables salads, soups, sauces Bay Leaf-Meat, fish, stews, poultry Burnet-Salad, vegetables (cucumber-like flavor) Caraway  Seed-Bread, cookies, cottage cheese, meat, vegetables, cheese, rice Cardamon-Baked goods, fruit, soups Celery Powder or seed-Salads, salad dressings, sauces, meatloaf, soup, bread.Do not use  celery salt Chervil-Meats, salads, fish, eggs, vegetables, cottage cheese (parsley-like flavor) Chili Power-Meatloaf, chicken cheese, corn, eggplant, egg dishes Chives-Salads cottage cheese, egg dishes, soups, vegetables,  sauces Cilantro-Salsa, casseroles Cinnamon-Baked goods, fruit, pork, lamb, chicken, carrots Cloves-Fruit, baked goods, fish, pot roast, green beans, beets, carrots Coriander-Pastry, cookies, meat, salads, cheese (lemon-orange flavor) Cumin-Meatloaf, fish,cheese, eggs, cabbage,fruit pie (caraway flavor) [employer], fruit, eggs, fish, poultry, cottage cheese, vegetables Dill Seed-Meat, cottage cheese, poultry, vegetables, fish, salads, bread Fennel Seed-Bread, cookies, apples, pork, eggs, fish, beets, cabbage, cheese, Licorice-like flavor Garlic-(buds or powder) Salads, meat, poultry, fish, bread, butter, vegetables, potatoes.Do not  use garlic salt Ginger-Fruit, vegetables, baked goods, meat, fish, poultry Horseradish Root-Meet, vegetables, butter Lemon Juice or Extract-Vegetables, fruit, tea, baked goods, fish salads Mace-Baked goods fruit, vegetables, fish, poultry (taste like nutmeg) Maple Extract-Syrups Marjoram-Meat, chicken, fish, vegetables, breads, green salads (taste like Sage) Mint-Tea, lamb, sherbet, vegetables, desserts, carrots, cabbage Mustard, Dry or Seed-Cheese, eggs, meats, vegetables, poultry Nutmeg-Baked goods, fruit, chicken, eggs, vegetables, desserts Onion Powder-Meat, fish, poultry, vegetables, cheese, eggs, bread, rice salads (Do not use   Onion salt) Orange Extract-Desserts, baked goods Oregano-Pasta, eggs, cheese, onions, pork, lamb, fish, chicken, vegetables, green salads Paprika-Meat, fish, poultry, eggs, cheese, vegetables Parsley Flakes-Butter, vegetables, meat fish, poultry, eggs, bread, salads (certain forms may   Contain sodium Pepper-Meat fish, poultry, vegetables, eggs Peppermint Extract-Desserts, baked goods Poppy Seed-Eggs, bread, cheese, fruit dressings, baked goods, noodles, vegetables, cottage  [employer], poultry, meat, fish, cauliflower, turnips,eggs bread Saffron-Rice, bread, veal, chicken, fish,  eggs Sage-Meat, fish, poultry, onions, eggplant, tomateos, pork, stews Savory-Eggs, salads, poultry, meat, rice, vegetables, soups, pork Tarragon-Meat, poultry, fish, eggs, butter, vegetables (licorice-like flavor)  Thyme-Meat, poultry, fish, eggs, vegetables, (clover-like flavor), sauces, soups Tumeric-Salads, butter, eggs, fish, rice, vegetables (saffron-like flavor) Vanilla Extract-Baked goods, candy Vinegar-Salads, vegetables, meat marinades Walnut Extract-baked goods, candy   2. Choose your Foods Wisely   The following is a list of foods to avoid which are high in sodium:  Meats-Avoid all smoked, canned, salt cured, dried and kosher meat and fish as well as Anchovies   [employer] meats:Bologna, Liverwurst, Pastrami Canned meat or fish  Marinated herring Caviar    Pepperoni Corned Beef   Pizza Dried chipped beef  Salami Frozen breaded fish or meat Salt pork Frankfurters or hot dogs  Sardines Gefilte fish   Sausage Ham (boiled ham, Proscuitto Smoked butt    spiced ham)   Spam      TV Dinners Vegetables Canned vegetables (Regular) Relish Canned mushrooms  Sauerkraut Olives    Tomato juice Pickles  Bakery and Dessert Products Canned puddings  Cream pies Cheesecake   Decorated cakes Cookies  Beverages/Juices Tomato juice, regular  Gatorade   V-8 vegetable juice, regular  Breads and Cereals Biscuit mixes   Salted potato chips, corn chips, pretzels Bread stuffing mixes  Salted crackers and rolls Pancake and waffle mixes Self-rising flour  Seasonings Accent    Meat sauces Barbecue sauce  Meat tenderizer Catsup    Monosodium glutamate (MSG) Celery salt   Onion salt Chili sauce   Prepared mustard Garlic salt   Salt, seasoned salt, sea salt Gravy mixes   Soy sauce Horseradish   Steak sauce Ketchup   Tartar sauce Lite salt    Teriyaki sauce Marinade mixes   Worcestershire sauce  Others Baking  powder   Cocoa and cocoa mixes Baking soda   Commercial  casserole mixes Candy-caramels, chocolate  Dehydrated soups    Bars, fudge,nougats  Instant rice and pasta mixes Canned broth or soup  Maraschino cherries Cheese, aged and processed cheese and cheese spreads  Learning Assessment Quiz  Indicated T (for True) or F (for False) for each of the following statements:  _____ Fresh fruits and vegetables and unprocessed grains are generally low in sodium _____ Water may contain a considerable amount of sodium, depending on the source _____ You can always tell if a food is high in sodium by tasting it _____ Certain laxatives my be high in sodium and should be avoided unless prescribed   by a physician or pharmacist _____ Salt substitutes may be used freely by anyone on a sodium restricted diet _____ Sodium is present in table salt, food additives and as a natural component of   most foods _____ Table salt is approximately 90% sodium _____ Limiting sodium intake may help prevent excess fluid accumulation in the body _____ On a sodium-restricted diet, seasonings such as bouillon soy sauce, and    cooking wine should be used in place of table salt _____ On an ingredient list, a product which lists monosodium glutamate as the first   ingredient is an appropriate food to include on a low sodium diet  Circle the best answer(s) to the following statements (Hint: there may be more than one correct answer)  11. On a low-sodium diet, some acceptable snack items are:    A. Olives  F. Bean dip   K. Grapefruit juice    B. Salted Pretzels G. Commercial Popcorn   L. Canned peaches    C. Carrot Sticks  H. Bouillon   M. Unsalted nuts   D. [place] fries  I. Peanut butter crackers N. Salami   E. Sweet pickles J. Tomato Juice   O. Pizza  12.  Seasonings that may be used freely on a reduced - sodium diet include   A. Lemon wedges F.Monosodium glutamate K. Celery seed    B.Soysauce   G. Pepper   L. Mustard powder   C. Sea salt  H. Cooking wine  M. Onion  flakes   D. Vinegar  E. Prepared horseradish N. Salsa   E. Sage   J. Worcestershire sauce  O. Chutney

## 2021-06-01 NOTE — Progress Notes (Signed)
Cardiology Office Note:    Date:  06/05/2021   ID:  Harold Bender, DOB Jan 13, 1991, MRN [id number]  PCP:  Patient, No Pcp Per (Inactive)   [facility] Providers Cardiologist:  Meriam Sprague, MD {   Referring MD: No ref. provider found     History of Present Illness:    Harold Bender is a 30 y.o. male with a hx of HTN and polysubstance abuse, and admission in 04/2021 for HTN emergency found to have EF 35-40% now presenting to clinic for follow-up.  Patient was admitted in 04/2021 for SOB found to have HTN emergency, newly diagnosed HFrEF and elevated troponin. Echocardiogram 05/07/21 showed ejection fraction 35 to 40%, mild left ventricular hypertrophy, grade 2 diastolic dysfunction, mild to moderate left atrial enlargement, mild right atrial enlargement, small pericardial effusion. He was initiated on antihypertensive agents and diuresed with significant improvement. Ischemic evaluation deferred as in-patient given lack of symptoms and suspicion for HTN-induced CM.  Last saw Jacolyn Reedy in clinic on 05/20/21 where he was doing very well. He quit smoking and abstained from cocaine. He was active and lost 10lbs. Complaint with all medications.  Today, the patient states that he has been doing well. He is exercising everyday. Has stopped smoking. Monitoring his blood pressure and has been running 140s. No chest pain, SOB, lightheadedness, or syncope. Compliant with all medications. No orthopnea, LE edema, or PND.  Past Medical History:  Diagnosis Date   Hypertension     Past Surgical History:  Procedure Laterality Date   ANKLE SURGERY Left     Current Medications: Current Meds  Medication Sig   acetaminophen (TYLENOL) 325 MG tablet Take 2 tablets (650 mg total) by mouth every 6 (six) hours as needed for mild pain (or Fever >/= 101).   albuterol (VENTOLIN HFA) 108 (90 Base) MCG/ACT inhaler Inhale 2 puffs into the lungs every 2 (two) hours as needed for wheezing or  shortness of breath.   dapagliflozin propanediol (FARXIGA) 10 MG TABS tablet Take 1 tablet (10 mg total) by mouth daily.   diphenhydrAMINE (BENADRYL) 25 mg capsule Take 25 mg by mouth every 8 (eight) hours as needed for allergies.   furosemide (LASIX) 40 MG tablet Take 1 tablet (40 mg total) by mouth daily.   sacubitril-valsartan (ENTRESTO) 97-103 MG Take 1 tablet by mouth 2 (two) times daily.   vitamin C (ASCORBIC ACID) 500 MG tablet Take 500 mg by mouth daily.   [DISCONTINUED] carvedilol (COREG) 25 MG tablet Take 1 tablet (25 mg total) by mouth 2 (two) times daily with a meal.   [DISCONTINUED] hydrALAZINE (APRESOLINE) 25 MG tablet Take 3 tablets (75 mg total) by mouth 3 (three) times daily.   [DISCONTINUED] isosorbide mononitrate (IMDUR) 30 MG 24 hr tablet Take 1 tablet (30 mg total) by mouth daily.   [DISCONTINUED] sacubitril-valsartan (ENTRESTO) 49-51 MG Take 1 tablet by mouth 2 (two) times daily.   [DISCONTINUED] spironolactone (ALDACTONE) 50 MG tablet Take 1 tablet (50 mg total) by mouth daily.     Allergies:   Apple   Social History   Socioeconomic History   Marital status: Single    Spouse name: Not on file   Number of children: Not on file   Years of education: Not on file   Highest education level: Not on file  Occupational History   Occupation: insurance  Tobacco Use   Smoking status: Former    Types: Cigarettes   Smokeless tobacco: Never   Tobacco comments:    With  alcohol  Substance and Sexual Activity   Alcohol use: Yes    Comment: binge drinking - 7-10 drinks on weekned nights   Drug use: Yes    Types: Marijuana, Cocaine    Comment: daily marijuana use; last used cocaine 2 weeks ago   Sexual activity: Yes    Birth control/protection: None  Other Topics Concern   Not on file  Social History Narrative   Not on file   Social Determinants of Health   Financial Resource Strain: Not on file  Food Insecurity: Not on file  Transportation Needs: Not on file   Physical Activity: Not on file  Stress: Not on file  Social Connections: Not on file     Family History: The patient's family history is not on file.  ROS:   Please see the history of present illness.    Review of Systems  Constitutional:  Negative for chills and fever.  Respiratory:  Negative for shortness of breath.   Cardiovascular:  Negative for chest pain, palpitations, orthopnea, claudication, leg swelling and PND.  Gastrointestinal:  Negative for nausea and vomiting.  Neurological:  Negative for dizziness and loss of consciousness.  Psychiatric/Behavioral:  Negative for substance abuse.     EKGs/Labs/Other Studies Reviewed:    The following studies were reviewed today: 2D echo 05/07/2021 IMPRESSIONS     1. Left ventricular ejection fraction, by estimation, is 35 to 40%. The  left ventricle has moderately decreased function. The left ventricle  demonstrates global hypokinesis. There is mild left ventricular  hypertrophy. Left ventricular diastolic  parameters are consistent with Grade II diastolic dysfunction  (pseudonormalization).   2. Right ventricular systolic function is normal. The right ventricular  size is normal. There is moderately elevated pulmonary artery systolic  pressure. The estimated right ventricular systolic pressure is 47.7 mmHg.   3. Left atrial size was mild to moderately dilated.   4. Right atrial size was mildly dilated.   5. A small pericardial effusion is present.   6. The mitral valve is normal in structure. Trivial mitral valve  regurgitation. No evidence of mitral stenosis.   7. The aortic valve is tricuspid. Aortic valve regurgitation is not  visualized. Aortic valve sclerosis/calcification is present, without any  evidence of aortic stenosis.   8. The inferior vena cava is normal in size with <50% respiratory  variability, suggesting right atrial pressure of 8 mmHg.   EKG:  No new tracing  Recent Labs: 05/06/2021: B Natriuretic  Peptide 1,019.0 05/08/2021: TSH 3.474 05/10/2021: ALT 25; BUN 12; Creatinine, Ser 1.10; Hemoglobin 15.5; Magnesium 2.2; Platelets 212; Potassium 3.6; Sodium 138  Recent Lipid Panel No results found for: CHOL, TRIG, HDL, CHOLHDL, VLDL, LDLCALC, LDLDIRECT   Risk Assessment/Calculations:           Physical Exam:    VS:  BP (!) 144/92   Pulse 92   Ht 5\' 11"  (1.803 m)   Wt 270 lb (122.5 kg)   SpO2 97%   BMI 37.66 kg/m     Wt Readings from Last 3 Encounters:  06/05/21 270 lb (122.5 kg)  05/20/21 266 lb 9.6 oz (120.9 kg)  05/06/21 264 lb 12.4 oz (120.1 kg)     GEN:  Well nourished, well developed in no acute distress HEENT: Normal NECK: No JVD; No carotid bruits CARDIAC: RRR, no murmurs, rubs, gallops RESPIRATORY:  Clear to auscultation without rales, wheezing or rhonchi  ABDOMEN: Soft, non-tender, non-distended MUSCULOSKELETAL:  No edema; No deformity  SKIN: Warm and dry  NEUROLOGIC:  Alert and oriented x 3 PSYCHIATRIC:  Normal affect   ASSESSMENT:    1. Chronic combined systolic and diastolic CHF (congestive heart failure) (HCC)   2. Uncontrolled hypertension   3. Suspected sleep apnea   4. Polysubstance abuse (HCC)    PLAN:    In order of problems listed above:  #Newly Diagnosed Systolic Heart Failure: #Suspected HTN-Induced CM: Patient presented to [facility] hospital in 04/2021 with worsening dyspnea, cough and orthopnea. Notably has poorly controlled HTN as has been off medications for over 2 years due to inability to afford them. Also with cocaine use. Here, patient grossly overloaded on exam with pulmonary edema on CXR. BNP 1000.  TTE with LVEF 35-40% with global hypokinesis, mild LVH, G2DD, moderate PHTN. Now improving with diuresis and BP management. -TTE with depressed EF (35-40%) with global hypokinesis likely secondary to hypertension induced CM -Plan for coronary CTA -Continue lasix 40mg  PO daily -Increase entresto to 97/103mg  BID -Continue coreg 25mg   BID -Continue spiro 50mg  daily -Continue hydralazine 75mg  TID -Continue imdur 30mg  daily -Unable to afford farxiga; he is working on patient assistance -Daily weights -Low Na diet   #HTN : -Continue amlodipine 10mg  daily -Increase entresto to 97/103mg  BID -Continue coreg 25mg  BID -Continue spiro 50mg  daily -Continue hydralazine 75mg  TID -Continue imdur 30mg  daily   #Suspected OSA: -Will need sleep study   #Polysubstance Abuse: Successfully quit!! -Continue to encourage cessation      Medication Adjustments/Labs and Tests Ordered: Current medicines are reviewed at length with the patient today.  Concerns regarding medicines are outlined above.  Orders Placed This Encounter  Procedures   ECHOCARDIOGRAM COMPLETE    Meds ordered this encounter  Medications   spironolactone (ALDACTONE) 50 MG tablet    Sig: Take 1 tablet (50 mg total) by mouth daily.    Dispense:  90 tablet    Refill:  3   isosorbide mononitrate (IMDUR) 30 MG 24 hr tablet    Sig: Take 1 tablet (30 mg total) by mouth daily.    Dispense:  90 tablet    Refill:  2   hydrALAZINE (APRESOLINE) 25 MG tablet    Sig: Take 3 tablets (75 mg total) by mouth 3 (three) times daily.    Dispense:  270 tablet    Refill:  2   carvedilol (COREG) 25 MG tablet    Sig: Take 1 tablet (25 mg total) by mouth 2 (two) times daily with a meal.    Dispense:  180 tablet    Refill:  2   sacubitril-valsartan (ENTRESTO) 97-103 MG    Sig: Take 1 tablet by mouth 2 (two) times daily.    Dispense:  60 tablet    Refill:  3     Patient Instructions  Medication Instructions:   INCREASE YOUR ENTRESTO TO 97/103 MG DOSE--TAKE 1 TABLET BY MOUTH TWICE DAILY  *If you need a refill on your cardiac medications before your next appointment, please call your pharmacy*   Testing/Procedures:  Your physician has requested that you have an echocardiogram. Echocardiography is a painless test that uses sound waves to create images of your heart.  It provides your doctor with information about the size and shape of your heart and how well your heart's chambers and valves are working. This procedure takes approximately one hour. There are no restrictions for this procedure.  IN 3 MONTHS AT SAME TIME OR AROUND SAME TIME AS HIS 3 MONTH FOLLOW-UP APPOINTMENT    Follow-Up:  ALREADY SCHEDULED IN  MARCH WITH DR. Sherren Kerns ECHO SCHEDULED AT THAT TIME   Signed, Meriam Sprague, MD  06/05/2021 2:30 PM    [facility]

## 2021-06-05 ENCOUNTER — Encounter: Payer: Self-pay | Admitting: Cardiology

## 2021-06-05 ENCOUNTER — Ambulatory Visit (INDEPENDENT_AMBULATORY_CARE_PROVIDER_SITE_OTHER): Payer: BC Managed Care – PPO | Admitting: Cardiology

## 2021-06-05 ENCOUNTER — Other Ambulatory Visit: Payer: Self-pay

## 2021-06-05 VITALS — BP 144/92 | HR 92 | Ht 71.0 in | Wt 270.0 lb

## 2021-06-05 DIAGNOSIS — I5042 Chronic combined systolic (congestive) and diastolic (congestive) heart failure: Secondary | ICD-10-CM

## 2021-06-05 DIAGNOSIS — I10 Essential (primary) hypertension: Secondary | ICD-10-CM | POA: Diagnosis not present

## 2021-06-05 DIAGNOSIS — F1910 Other psychoactive substance abuse, uncomplicated: Secondary | ICD-10-CM

## 2021-06-05 DIAGNOSIS — R29818 Other symptoms and signs involving the nervous system: Secondary | ICD-10-CM

## 2021-06-05 MED ORDER — CARVEDILOL 25 MG PO TABS: 25.0000 mg | ORAL_TABLET | Freq: Two times a day (BID) | ORAL | 2 refills | Status: DC

## 2021-06-05 MED ORDER — HYDRALAZINE HCL 25 MG PO TABS: 75.0000 mg | ORAL_TABLET | Freq: Three times a day (TID) | ORAL | 2 refills | Status: DC

## 2021-06-05 MED ORDER — SPIRONOLACTONE 50 MG PO TABS: 50.0000 mg | ORAL_TABLET | Freq: Every day | ORAL | 3 refills | Status: DC

## 2021-06-05 MED ORDER — ENTRESTO 97-103 MG PO TABS: 1.0000 | ORAL_TABLET | Freq: Two times a day (BID) | ORAL | 3 refills | Status: DC

## 2021-06-05 MED ORDER — ISOSORBIDE MONONITRATE ER 30 MG PO TB24: 30.0000 mg | ORAL_TABLET | Freq: Every day | ORAL | 2 refills | Status: DC

## 2021-06-05 NOTE — Patient Instructions (Signed)
Medication Instructions:   INCREASE YOUR ENTRESTO TO 97/103 MG DOSE--TAKE 1 TABLET BY MOUTH TWICE DAILY  *If you need a refill on your cardiac medications before your next appointment, please call your pharmacy*   Testing/Procedures:  Your physician has requested that you have an echocardiogram. Echocardiography is a painless test that uses sound waves to create images of your heart. It provides your doctor with information about the size and shape of your heart and how well your hearts chambers and valves are working. This procedure takes approximately one hour. There are no restrictions for this procedure.  IN 3 MONTHS AT SAME TIME OR AROUND SAME TIME AS HIS 3 MONTH FOLLOW-UP APPOINTMENT    Follow-Up:  ALREADY SCHEDULED IN MARCH WITH DR. Sherren Kerns ECHO SCHEDULED AT THAT TIME

## 2021-06-09 ENCOUNTER — Encounter (HOSPITAL_COMMUNITY): Payer: Self-pay | Admitting: Radiology

## 2021-06-10 ENCOUNTER — Encounter: Payer: Self-pay | Admitting: Cardiology

## 2021-06-10 DIAGNOSIS — I5042 Chronic combined systolic (congestive) and diastolic (congestive) heart failure: Secondary | ICD-10-CM

## 2021-06-10 DIAGNOSIS — R072 Precordial pain: Secondary | ICD-10-CM

## 2021-06-10 DIAGNOSIS — I428 Other cardiomyopathies: Secondary | ICD-10-CM

## 2021-06-17 MED ORDER — METOPROLOL TARTRATE 100 MG PO TABS: 100.0000 mg | ORAL_TABLET | Freq: Once | ORAL | 0 refills | Status: AC

## 2021-06-17 MED ORDER — FUROSEMIDE 40 MG PO TABS: 40.0000 mg | ORAL_TABLET | Freq: Every day | ORAL | 0 refills | Status: DC

## 2021-06-17 NOTE — Telephone Encounter (Signed)
Meriam Sprague, MD  Loa Socks, LPN Omg you are a hero. Yes to refilling the lasix and yes to the coronary CTA. Thank you so much for following up on all of that!

## 2021-06-17 NOTE — Telephone Encounter (Signed)
Order for Cardiac CT placed as well as future BMET.  Cardiac CT instructions provided to the pt via mychart message.  Pt is aware he will get a call back from our CT Scheduler to arrange this appt, once pre-cert is complete.   Pt verbalized understanding and agrees with this plan.

## 2021-06-17 NOTE — Telephone Encounter (Signed)
ct heart Received: Today Burnadette Peter Haywood Pao, RN; Zipporah Plants, LPN Scheduled 10/24/98 at 4:00    Thanks,  [place]

## 2021-06-19 ENCOUNTER — Encounter: Payer: Self-pay | Admitting: Cardiology

## 2021-06-24 ENCOUNTER — Telehealth (HOSPITAL_COMMUNITY): Payer: Self-pay | Admitting: Emergency Medicine

## 2021-06-24 NOTE — Telephone Encounter (Signed)
Attempted to call patient regarding upcoming cardiac CT appointment. Left message on voicemail with name and callback number Rockwell Alexandria RN Navigator Cardiac Imaging [facility] [phone] Office [phone] Cell  Requested patient get labs prior to CCTA

## 2021-06-26 ENCOUNTER — Telehealth (HOSPITAL_COMMUNITY): Payer: Self-pay | Admitting: Emergency Medicine

## 2021-06-26 NOTE — Telephone Encounter (Signed)
Attempted to call patient regarding upcoming cardiac CT appointment. °Left message on voicemail with name and callback number °Undine Nealis RN Navigator Cardiac Imaging °[facility] °[phone] Office °[phone] Cell ° °

## 2021-06-27 ENCOUNTER — Ambulatory Visit (HOSPITAL_COMMUNITY)
Admission: RE | Admit: 2021-06-27 | Discharge: 2021-06-27 | Disposition: A | Discharge status: Home / Self Care | Payer: BC Managed Care – PPO | Source: Ambulatory Visit | Attending: Cardiology | Admitting: Cardiology

## 2021-06-27 ENCOUNTER — Other Ambulatory Visit: Payer: Self-pay

## 2021-06-27 DIAGNOSIS — I5042 Chronic combined systolic (congestive) and diastolic (congestive) heart failure: Secondary | ICD-10-CM | POA: Diagnosis not present

## 2021-06-27 DIAGNOSIS — R072 Precordial pain: Secondary | ICD-10-CM | POA: Diagnosis not present

## 2021-06-27 DIAGNOSIS — I428 Other cardiomyopathies: Secondary | ICD-10-CM | POA: Diagnosis not present

## 2021-06-27 LAB — POCT I-STAT CREATININE: Creatinine, Ser: 1.3 mg/dL — ABNORMAL HIGH (ref 0.61–1.24)

## 2021-06-27 MED ORDER — NITROGLYCERIN 0.4 MG SL SUBL
0.8000 mg | SUBLINGUAL_TABLET | Freq: Once | SUBLINGUAL | Status: AC
  Administered 2021-06-27: 0.8 mg via SUBLINGUAL

## 2021-06-27 MED ORDER — IOHEXOL 350 MG/ML SOLN
100.0000 mL | Freq: Once | INTRAVENOUS | Status: AC | PRN
  Administered 2021-06-27: 100 mL via INTRAVENOUS

## 2021-06-27 MED ORDER — NITROGLYCERIN 0.4 MG SL SUBL
SUBLINGUAL_TABLET | SUBLINGUAL | Status: AC
  Filled 2021-06-27: qty 2

## 2021-06-27 MED ORDER — METOPROLOL TARTRATE 5 MG/5ML IV SOLN
INTRAVENOUS | Status: AC
  Administered 2021-06-27: 10 mg
  Filled 2021-06-27: qty 10

## 2021-06-30 ENCOUNTER — Telehealth: Payer: Self-pay | Admitting: *Deleted

## 2021-06-30 DIAGNOSIS — I428 Other cardiomyopathies: Secondary | ICD-10-CM

## 2021-06-30 DIAGNOSIS — I10 Essential (primary) hypertension: Secondary | ICD-10-CM

## 2021-06-30 DIAGNOSIS — R7989 Other specified abnormal findings of blood chemistry: Secondary | ICD-10-CM

## 2021-06-30 DIAGNOSIS — Z79899 Other long term (current) drug therapy: Secondary | ICD-10-CM

## 2021-06-30 DIAGNOSIS — I5042 Chronic combined systolic (congestive) and diastolic (congestive) heart failure: Secondary | ICD-10-CM

## 2021-06-30 NOTE — Telephone Encounter (Signed)
-----   Message from Meriam Sprague, MD sent at 06/28/2021  7:38 AM EST ----- This reflects his increase in entresto. No changes in meds at this time. Can we just repeat a BMET in 7-10 days to ensure remaining stable? We may need to adjust his lasix dosing.

## 2021-06-30 NOTE — Telephone Encounter (Signed)
The patient has been notified of the result and verbalized understanding.  All questions (if any) were answered.  Went ahead and placed the repeat BMET in the system for the pt to have done in 7-10 days, per Dr. Shari Prows.  Pt states he will message me back soon, or call the office, when he coordinates a good day to have this lab done in 7-10 days, for he needs to check his work schedule first.  Harold Bender ahead and placed the BMET order and will await for pt to contact us back with a good date for him to have this drawn in that time frame. Pt verbalized understanding and agrees with this plan.

## 2021-07-02 ENCOUNTER — Encounter: Payer: Self-pay | Admitting: *Deleted

## 2021-07-08 ENCOUNTER — Other Ambulatory Visit: Payer: Self-pay

## 2021-07-08 ENCOUNTER — Other Ambulatory Visit: Payer: BC Managed Care – PPO | Admitting: *Deleted

## 2021-07-08 ENCOUNTER — Telehealth: Payer: Self-pay | Admitting: *Deleted

## 2021-07-08 DIAGNOSIS — I428 Other cardiomyopathies: Secondary | ICD-10-CM

## 2021-07-08 DIAGNOSIS — R072 Precordial pain: Secondary | ICD-10-CM | POA: Diagnosis not present

## 2021-07-08 DIAGNOSIS — I5042 Chronic combined systolic (congestive) and diastolic (congestive) heart failure: Secondary | ICD-10-CM

## 2021-07-08 DIAGNOSIS — R7989 Other specified abnormal findings of blood chemistry: Secondary | ICD-10-CM

## 2021-07-08 DIAGNOSIS — I10 Essential (primary) hypertension: Secondary | ICD-10-CM

## 2021-07-08 DIAGNOSIS — Z79899 Other long term (current) drug therapy: Secondary | ICD-10-CM

## 2021-07-08 LAB — BASIC METABOLIC PANEL
BUN/Creatinine Ratio: 17 (ref 9–20)
BUN: 22 mg/dL — ABNORMAL HIGH (ref 6–20)
CO2: 26 mmol/L (ref 20–29)
Calcium: 9.7 mg/dL (ref 8.7–10.2)
Chloride: 101 mmol/L (ref 96–106)
Creatinine, Ser: 1.32 mg/dL — ABNORMAL HIGH (ref 0.76–1.27)
Glucose: 118 mg/dL — ABNORMAL HIGH (ref 70–99)
Potassium: 4.5 mmol/L (ref 3.5–5.2)
Sodium: 140 mmol/L (ref 134–144)
eGFR: 73 mL/min/{1.73_m2} (ref >59)

## 2021-07-08 MED ORDER — FUROSEMIDE 20 MG PO TABS: 20.0000 mg | ORAL_TABLET | Freq: Every day | ORAL | 1 refills | Status: DC

## 2021-07-08 NOTE — Telephone Encounter (Signed)
Pt made aware of lab results and recommendations per Dr. Shari Prows.  Pt states his fluid levels are good with very much improved swelling.  Informed the pt that being his fluid levels are good, Dr. Shari Prows wants to decrease his lasix down to taking 20 mg po daily, and if he gains fluid he should let us know and we can increase it back to 40 mg po daily. Confirmed the pharmacy of choice with the pt.  Pt verbalized understanding and agrees with this plan.

## 2021-07-08 NOTE — Telephone Encounter (Signed)
-----   Message from Freada Bergeron, MD sent at 07/08/2021  5:02 PM EST ----- His kidney function is overall stable. How is his fluid status? If good, let's decrease his lasix down to 20mg  daily and if he gains fluid, we can increase back up to 40mg  daily.

## 2021-08-11 ENCOUNTER — Other Ambulatory Visit: Payer: Self-pay

## 2021-08-11 DIAGNOSIS — I5042 Chronic combined systolic (congestive) and diastolic (congestive) heart failure: Secondary | ICD-10-CM

## 2021-08-11 MED ORDER — HYDRALAZINE HCL 25 MG PO TABS: 75.0000 mg | ORAL_TABLET | Freq: Three times a day (TID) | ORAL | 11 refills | Status: DC

## 2021-09-05 ENCOUNTER — Ambulatory Visit: Payer: BC Managed Care – PPO | Admitting: Cardiology

## 2021-09-05 NOTE — Progress Notes (Deleted)
?Cardiology Office Note:   ? ?Date:  09/05/2021  ? ?ID:  Harold Bender, DOB July 26, 1990, MRN [id number] ? ?PCP:  Patient, No Pcp Per (Inactive) ?  ?[facility] Providers ?Cardiologist:  Meriam Sprague, MD { ? ? ?Referring MD: No ref. provider found  ? ? ? ?History of Present Illness:   ? ?Harold Bender is a 31 y.o. male with a hx of HTN and polysubstance abuse, and admission in 04/2021 for HTN emergency found to have EF 35-40% now presenting to clinic for follow-up. ? ?Patient was admitted in 04/2021 for SOB found to have HTN emergency, newly diagnosed HFrEF and elevated troponin. Echocardiogram 05/07/21 showed ejection fraction 35 to 40%, mild left ventricular hypertrophy, grade 2 diastolic dysfunction, mild to moderate left atrial enlargement, mild right atrial enlargement, small pericardial effusion. He was initiated on antihypertensive agents and diuresed with significant improvement. Ischemic evaluation deferred as in-patient given lack of symptoms and suspicion for HTN-induced CM. ? ?Last saw Harold Bender in clinic on 05/20/21 where he was doing very well. He quit smoking and abstained from cocaine. He was active and lost 10lbs. Complaint with all medications. ? ?Today, the patient states that he has been doing well. He is exercising everyday. Has stopped smoking. Monitoring his blood pressure and has been running 140s. No chest pain, SOB, lightheadedness, or syncope. Compliant with all medications. No orthopnea, LE edema, or PND. ? ?Past Medical History:  ?Diagnosis Date  ? Hypertension   ? ? ?Past Surgical History:  ?Procedure Laterality Date  ? ANKLE SURGERY Left   ? ? ?Current Medications: ?No outpatient medications have been marked as taking for the 09/09/21 encounter (Appointment) with Meriam Sprague, MD.  ?  ? ?Allergies:   Apple juice  ? ?Social History  ? ?Socioeconomic History  ? Marital status: Single  ?  Spouse name: Not on file  ? Number of children: Not on file  ? Years of education: Not  on file  ? Highest education level: Not on file  ?Occupational History  ? Occupation: insurance  ?Tobacco Use  ? Smoking status: Former  ?  Types: Cigarettes  ? Smokeless tobacco: Never  ? Tobacco comments:  ?  With alcohol  ?Substance and Sexual Activity  ? Alcohol use: Yes  ?  Comment: binge drinking - 7-10 drinks on weekned nights  ? Drug use: Yes  ?  Types: Marijuana, Cocaine  ?  Comment: daily marijuana use; last used cocaine 2 weeks ago  ? Sexual activity: Yes  ?  Birth control/protection: None  ?Other Topics Concern  ? Not on file  ?Social History Narrative  ? Not on file  ? ?Social Determinants of Health  ? ?Financial Resource Strain: Not on file  ?Food Insecurity: Not on file  ?Transportation Needs: Not on file  ?Physical Activity: Not on file  ?Stress: Not on file  ?Social Connections: Not on file  ?  ? ?Family History: ?The patient's family history is not on file. ? ?ROS:   ?Please see the history of present illness.    ?Review of Systems  ?Constitutional:  Negative for chills and fever.  ?Respiratory:  Negative for shortness of breath.   ?Cardiovascular:  Negative for chest pain, palpitations, orthopnea, claudication, leg swelling and PND.  ?Gastrointestinal:  Negative for nausea and vomiting.  ?Neurological:  Negative for dizziness and loss of consciousness.  ?Psychiatric/Behavioral:  Negative for substance abuse.    ? ?EKGs/Labs/Other Studies Reviewed:   ? ?The following studies were reviewed today: ?  2D echo 05/07/2021 ?IMPRESSIONS  ? ? ? 1. Left ventricular ejection fraction, by estimation, is 35 to 40%. The  ?left ventricle has moderately decreased function. The left ventricle  ?demonstrates global hypokinesis. There is mild left ventricular  ?hypertrophy. Left ventricular diastolic  ?parameters are consistent with Grade II diastolic dysfunction  ?(pseudonormalization).  ? 2. Right ventricular systolic function is normal. The right ventricular  ?size is normal. There is moderately elevated pulmonary  artery systolic  ?pressure. The estimated right ventricular systolic pressure is 47.7 mmHg.  ? 3. Left atrial size was mild to moderately dilated.  ? 4. Right atrial size was mildly dilated.  ? 5. A small pericardial effusion is present.  ? 6. The mitral valve is normal in structure. Trivial mitral valve  ?regurgitation. No evidence of mitral stenosis.  ? 7. The aortic valve is tricuspid. Aortic valve regurgitation is not  ?visualized. Aortic valve sclerosis/calcification is present, without any  ?evidence of aortic stenosis.  ? 8. The inferior vena cava is normal in size with <50% respiratory  ?variability, suggesting right atrial pressure of 8 mmHg.  ? ?EKG:  No new tracing ? ?Recent Labs: ?05/06/2021: B Natriuretic Peptide 1,019.0 ?05/08/2021: TSH 3.474 ?05/10/2021: ALT 25; Hemoglobin 15.5; Magnesium 2.2; Platelets 212 ?07/08/2021: BUN 22; Creatinine, Ser 1.32; Potassium 4.5; Sodium 140  ?Recent Lipid Panel ?No results found for: CHOL, TRIG, HDL, CHOLHDL, VLDL, LDLCALC, LDLDIRECT ? ? ?Risk Assessment/Calculations:   ?  ? ?    ? ?Physical Exam:   ? ?VS:  There were no vitals taken for this visit.   ? ?Wt Readings from Last 3 Encounters:  ?06/05/21 270 lb (122.5 kg)  ?05/20/21 266 lb 9.6 oz (120.9 kg)  ?05/06/21 264 lb 12.4 oz (120.1 kg)  ?  ? ?GEN:  Well nourished, well developed in no acute distress ?HEENT: Normal ?NECK: No JVD; No carotid bruits ?CARDIAC: RRR, no murmurs, rubs, gallops ?RESPIRATORY:  Clear to auscultation without rales, wheezing or rhonchi  ?ABDOMEN: Soft, non-tender, non-distended ?MUSCULOSKELETAL:  No edema; No deformity  ?SKIN: Warm and dry ?NEUROLOGIC:  Alert and oriented x 3 ?PSYCHIATRIC:  Normal affect  ? ?ASSESSMENT:   ? ?No diagnosis found. ? ?PLAN:   ? ?In order of problems listed above: ? ?#Newly Diagnosed Systolic Heart Failure: ?#Suspected HTN-Induced CM: ?Patient presented to [facility] hospital in 04/2021 with worsening dyspnea, cough and orthopnea. Notably has poorly controlled HTN as has  been off medications for over 2 years due to inability to afford them. Also with cocaine use. Here, patient grossly overloaded on exam with pulmonary edema on CXR. BNP 1000.  TTE with LVEF 35-40% with global hypokinesis, mild LVH, G2DD, moderate PHTN. Now improving with diuresis and BP management. ?-TTE with depressed EF (35-40%) with global hypokinesis likely secondary to hypertension induced CM ?-Plan for coronary CTA ?-Continue lasix 40mg  PO daily ?-Increase entresto to 97/103mg  BID ?-Continue coreg 25mg  BID ?-Continue spiro 50mg  daily ?-Continue hydralazine 75mg  TID ?-Continue imdur 30mg  daily ?-Unable to afford farxiga; he is working on patient assistance ?-Daily weights ?-Low Na diet ?  ?#HTN : ?-Continue amlodipine 10mg  daily ?-Increase entresto to 97/103mg  BID ?-Continue coreg 25mg  BID ?-Continue spiro 50mg  daily ?-Continue hydralazine 75mg  TID ?-Continue imdur 30mg  daily ?  ?#Suspected OSA: ?-Will need sleep study ?  ?#Polysubstance Abuse: ?Successfully quit!! ?-Continue to encourage cessation ? ?   ? ?Medication Adjustments/Labs and Tests Ordered: ?Current medicines are reviewed at length with the patient today.  Concerns regarding medicines are outlined above.  ?  No orders of the defined types were placed in this encounter. ? ? ?No orders of the defined types were placed in this encounter. ? ? ? ?There are no Patient Instructions on file for this visit.  ? ?Signed, ?Meriam Sprague, MD  ?09/05/2021 11:27 AM    ?[facility] ? ?

## 2021-09-09 ENCOUNTER — Ambulatory Visit: Payer: BC Managed Care – PPO | Admitting: Cardiology

## 2021-09-09 ENCOUNTER — Other Ambulatory Visit (HOSPITAL_COMMUNITY): Payer: BC Managed Care – PPO

## 2021-10-07 ENCOUNTER — Other Ambulatory Visit: Payer: Self-pay

## 2021-10-07 MED ORDER — FUROSEMIDE 20 MG PO TABS: 20.0000 mg | ORAL_TABLET | Freq: Every day | ORAL | 1 refills | Status: DC

## 2021-10-10 ENCOUNTER — Ambulatory Visit: Payer: BC Managed Care – PPO | Admitting: Cardiology

## 2021-10-10 ENCOUNTER — Ambulatory Visit (HOSPITAL_COMMUNITY): Payer: BC Managed Care – PPO

## 2021-10-16 ENCOUNTER — Other Ambulatory Visit: Payer: Self-pay

## 2021-10-16 DIAGNOSIS — I5042 Chronic combined systolic (congestive) and diastolic (congestive) heart failure: Secondary | ICD-10-CM

## 2021-10-16 MED ORDER — ENTRESTO 97-103 MG PO TABS: 1.0000 | ORAL_TABLET | Freq: Two times a day (BID) | ORAL | 2 refills | Status: AC

## 2021-11-10 ENCOUNTER — Other Ambulatory Visit: Payer: Self-pay

## 2021-11-10 MED ORDER — FUROSEMIDE 20 MG PO TABS: 20.0000 mg | ORAL_TABLET | Freq: Every day | ORAL | 2 refills | Status: AC

## 2021-11-30 NOTE — Progress Notes (Deleted)
Cardiology Office Note:    Date:  11/30/2021   ID:  [name], DOB December 05, 1990, MRN [id number]  PCP:  Patient, No Pcp Per (Inactive)   [facility] Providers Cardiologist:  Meriam Sprague, MD {   Referring MD: Meriam Sprague, MD     History of Present Illness:    Harold Bender is a 31 y.o. male with a hx of HTN and polysubstance abuse, and admission in 04/2021 for HTN emergency found to have EF 35-40% now presenting to clinic for follow-up.  Patient was admitted in 04/2021 for SOB found to have HTN emergency, newly diagnosed HFrEF and elevated troponin. Echocardiogram 05/07/21 showed ejection fraction 35 to 40%, mild left ventricular hypertrophy, grade 2 diastolic dysfunction, mild to moderate left atrial enlargement, mild right atrial enlargement, small pericardial effusion. He was initiated on antihypertensive agents and diuresed with significant improvement. Ischemic evaluation deferred as in-patient given lack of symptoms and suspicion for HTN-induced CM.  Saw Harold Bender in clinic on 05/20/21 where he was doing very well. He quit smoking and abstained from cocaine. He was active and lost 10lbs. Complaint with all medications.  Was last seen in clinic on 05/2021 where he was doing very well from a CV standpoint. Compliant with medications. Coronary CTA 06/2021 with no evidence of CAD  Today, ***  Past Medical History:  Diagnosis Date   Hypertension     Past Surgical History:  Procedure Laterality Date   ANKLE SURGERY Left     Current Medications: No outpatient medications have been marked as taking for the 12/03/21 encounter (Appointment) with Meriam Sprague, MD.     Allergies:   Apple juice   Social History   Socioeconomic History   Marital status: Single    Spouse name: Not on file   Number of children: Not on file   Years of education: Not on file   Highest education level: Not on file  Occupational History   Occupation: insurance   Tobacco Use   Smoking status: Former    Types: Cigarettes   Smokeless tobacco: Never   Tobacco comments:    With alcohol  Substance and Sexual Activity   Alcohol use: Yes    Comment: binge drinking - 7-10 drinks on weekned nights   Drug use: Yes    Types: Marijuana, Cocaine    Comment: daily marijuana use; last used cocaine 2 weeks ago   Sexual activity: Yes    Birth control/protection: None  Other Topics Concern   Not on file  Social History Narrative   Not on file   Social Determinants of Health   Financial Resource Strain: Not on file  Food Insecurity: Not on file  Transportation Needs: Not on file  Physical Activity: Not on file  Stress: Not on file  Social Connections: Not on file     Family History: The patient's family history is not on file.  ROS:   Please see the history of present illness.    Review of Systems  Constitutional:  Negative for chills and fever.  Respiratory:  Negative for shortness of breath.   Cardiovascular:  Negative for chest pain, palpitations, orthopnea, claudication, leg swelling and PND.  Gastrointestinal:  Negative for nausea and vomiting.  Neurological:  Negative for dizziness and loss of consciousness.  Psychiatric/Behavioral:  Negative for substance abuse.      EKGs/Labs/Other Studies Reviewed:    The following studies were reviewed today: Coronary CTA 06/2021: FINDINGS: A 100 kV prospective scan was triggered in  the descending thoracic aorta at 111 HU's. Axial non-contrast 3 mm slices were carried out through the heart. The data set was analyzed on a dedicated work station and scored using the Agatson method. Gantry rotation speed was 250 msecs and collimation was .6 mm. 0.8 mg of sl NTG was given. The 3D data set was reconstructed in 5% intervals of the 67-82 % of the R-R cycle. Diastolic phases were analyzed on a dedicated work station using MPR, MIP and VRT modes. The patient received 80 cc of contrast.   Image  quality: good   Aorta:  Normal size.  No calcifications.  No dissection.   Aortic Valve:  Trileaflet.  No calcifications.   Coronary Arteries:  Normal coronary origin.  Right dominance.   RCA is a large dominant artery that gives rise to PDA and PLA. There is no plaque.   Left main is a large artery that gives rise to LAD and LCX arteries.   LAD is a large vessel that has no plaque.   LCX is a non-dominant artery that gives rise to one large OM1 branch. There is no plaque.   Other findings:   Normal pulmonary vein drainage into the left atrium.   Normal left atrial appendage without a thrombus.   Normal size of the pulmonary artery (30 mm borderline).   EF 36%   Please see radiology report for non cardiac findings.   IMPRESSION: 1. Coronary calcium score of 0.   2. Normal coronary origin with right dominance.   3. No evidence of CAD.   4.  Non ischemic cardiomyopathy - EF 36% 2D echo 05/07/2021 IMPRESSIONS     1. Left ventricular ejection fraction, by estimation, is 35 to 40%. The  left ventricle has moderately decreased function. The left ventricle  demonstrates global hypokinesis. There is mild left ventricular  hypertrophy. Left ventricular diastolic  parameters are consistent with Grade II diastolic dysfunction  (pseudonormalization).   2. Right ventricular systolic function is normal. The right ventricular  size is normal. There is moderately elevated pulmonary artery systolic  pressure. The estimated right ventricular systolic pressure is 47.7 mmHg.   3. Left atrial size was mild to moderately dilated.   4. Right atrial size was mildly dilated.   5. A small pericardial effusion is present.   6. The mitral valve is normal in structure. Trivial mitral valve  regurgitation. No evidence of mitral stenosis.   7. The aortic valve is tricuspid. Aortic valve regurgitation is not  visualized. Aortic valve sclerosis/calcification is present, without any  evidence  of aortic stenosis.   8. The inferior vena cava is normal in size with <50% respiratory  variability, suggesting right atrial pressure of 8 mmHg.   EKG:  No new tracing  Recent Labs: 05/06/2021: B Natriuretic Peptide 1,019.0 05/08/2021: TSH 3.474 05/10/2021: ALT 25; Hemoglobin 15.5; Magnesium 2.2; Platelets 212 07/08/2021: BUN 22; Creatinine, Ser 1.32; Potassium 4.5; Sodium 140  Recent Lipid Panel No results found for: "CHOL", "TRIG", "HDL", "CHOLHDL", "VLDL", "LDLCALC", "LDLDIRECT"   Risk Assessment/Calculations:           Physical Exam:    VS:  There were no vitals taken for this visit.    Wt Readings from Last 3 Encounters:  06/05/21 270 lb (122.5 kg)  05/20/21 266 lb 9.6 oz (120.9 kg)  05/06/21 264 lb 12.4 oz (120.1 kg)     GEN:  Well nourished, well developed in no acute distress HEENT: Normal NECK: No JVD; No carotid bruits  CARDIAC: RRR, no murmurs, rubs, gallops RESPIRATORY:  Clear to auscultation without rales, wheezing or rhonchi  ABDOMEN: Soft, non-tender, non-distended MUSCULOSKELETAL:  No edema; No deformity  SKIN: Warm and dry NEUROLOGIC:  Alert and oriented x 3 PSYCHIATRIC:  Normal affect   ASSESSMENT:    No diagnosis found.  PLAN:    In order of problems listed above:  #Chronic Systolic Heart Failure: # HTN-Induced CM: Patient presented to [facility] hospital in 04/2021 with worsening dyspnea, cough and orthopnea. Notably has poorly controlled HTN as has been off medications for over 2 years due to inability to afford them. Also with cocaine use. There, patient grossly overloaded on exam with pulmonary edema on CXR. BNP 1000.  TTE with LVEF 35-40% with global hypokinesis, mild LVH, G2DD, moderate PHTN. He was diuresed successfully. Currently euvolemic and compensated. Coronary CTA with no evidence of CAD.  -TTE with depressed EF (35-40%) with global hypokinesis likely secondary to hypertension induced CM -CTA with no evidence of CAD -Continue lasix 40mg  PO  daily -Continue entresto 97/103mg  BID -Continue coreg 25mg  BID -Continue spiro 50mg  daily -Continue hydralazine 75mg  TID -Continue imdur 30mg  daily -Unable to afford farxiga; he is working on patient assistance -Daily weights -Low Na diet   #HTN : -Continue amlodipine 10mg  daily -Continue entresto 97/103mg  BID -Continue coreg 25mg  BID -Continue spiro 50mg  daily -Continue hydralazine 75mg  TID -Continue imdur 30mg  daily   #Suspected OSA: -Will need sleep study   #Polysubstance Abuse: Successfully quit!! -Continue to encourage cessation      Medication Adjustments/Labs and Tests Ordered: Current medicines are reviewed at length with the patient today.  Concerns regarding medicines are outlined above.  No orders of the defined types were placed in this encounter.   No orders of the defined types were placed in this encounter.    There are no Patient Instructions on file for this visit.   Signed, Meriam Sprague, MD  11/30/2021 8:03 PM    [facility]

## 2021-12-03 ENCOUNTER — Ambulatory Visit: Payer: BC Managed Care – PPO | Admitting: Cardiology

## 2021-12-03 ENCOUNTER — Encounter (HOSPITAL_COMMUNITY): Payer: Self-pay

## 2021-12-03 ENCOUNTER — Ambulatory Visit (HOSPITAL_COMMUNITY): Payer: BC Managed Care – PPO | Attending: Internal Medicine

## 2021-12-03 ENCOUNTER — Encounter (HOSPITAL_COMMUNITY): Payer: Self-pay | Admitting: Cardiology

## 2021-12-03 NOTE — Progress Notes (Signed)
Verified appointment "no show" status with Casey Burkitt at 08:35.

## 2022-02-27 ENCOUNTER — Other Ambulatory Visit: Payer: Self-pay

## 2022-02-27 DIAGNOSIS — I5042 Chronic combined systolic (congestive) and diastolic (congestive) heart failure: Secondary | ICD-10-CM

## 2022-02-27 MED ORDER — CARVEDILOL 25 MG PO TABS: 25.0000 mg | ORAL_TABLET | Freq: Two times a day (BID) | ORAL | 1 refills | Status: DC

## 2022-02-27 MED ORDER — ISOSORBIDE MONONITRATE ER 30 MG PO TB24: 30.0000 mg | ORAL_TABLET | Freq: Every day | ORAL | 1 refills | Status: DC

## 2022-02-27 NOTE — Addendum Note (Signed)
Addended by: Margaret Pyle D on: 02/27/2022 07:32 AM   Modules accepted: Orders

## 2022-06-01 ENCOUNTER — Other Ambulatory Visit: Payer: Self-pay | Admitting: Cardiology

## 2022-06-01 DIAGNOSIS — I5042 Chronic combined systolic (congestive) and diastolic (congestive) heart failure: Secondary | ICD-10-CM

## 2022-06-29 ENCOUNTER — Other Ambulatory Visit: Payer: Self-pay

## 2022-06-29 DIAGNOSIS — I5042 Chronic combined systolic (congestive) and diastolic (congestive) heart failure: Secondary | ICD-10-CM

## 2022-06-29 MED ORDER — SPIRONOLACTONE 50 MG PO TABS: 50.0000 mg | ORAL_TABLET | Freq: Every day | ORAL | 0 refills | Status: DC

## 2022-07-27 ENCOUNTER — Other Ambulatory Visit: Payer: Self-pay

## 2022-07-27 DIAGNOSIS — I5042 Chronic combined systolic (congestive) and diastolic (congestive) heart failure: Secondary | ICD-10-CM

## 2022-07-27 MED ORDER — SPIRONOLACTONE 50 MG PO TABS: 50.0000 mg | ORAL_TABLET | Freq: Every day | ORAL | 0 refills | Status: DC

## 2022-08-26 ENCOUNTER — Other Ambulatory Visit: Payer: Self-pay | Admitting: Cardiology

## 2022-08-26 DIAGNOSIS — I5042 Chronic combined systolic (congestive) and diastolic (congestive) heart failure: Secondary | ICD-10-CM

## 2022-08-27 ENCOUNTER — Other Ambulatory Visit: Payer: Self-pay

## 2022-08-27 MED ORDER — SPIRONOLACTONE 50 MG PO TABS
50.0000 mg | ORAL_TABLET | Freq: Every day | ORAL | 0 refills | Status: DC
  Filled 2022-08-27: qty 15, 15d supply, fill #0

## 2022-09-21 ENCOUNTER — Other Ambulatory Visit: Payer: Self-pay

## 2022-09-21 DIAGNOSIS — I5042 Chronic combined systolic (congestive) and diastolic (congestive) heart failure: Secondary | ICD-10-CM

## 2022-09-21 MED ORDER — HYDRALAZINE HCL 25 MG PO TABS: 75.0000 mg | ORAL_TABLET | Freq: Three times a day (TID) | ORAL | 0 refills | Status: DC

## 2022-09-23 ENCOUNTER — Other Ambulatory Visit: Payer: Self-pay | Admitting: *Deleted

## 2022-09-23 DIAGNOSIS — I5042 Chronic combined systolic (congestive) and diastolic (congestive) heart failure: Secondary | ICD-10-CM

## 2022-09-23 MED ORDER — SPIRONOLACTONE 50 MG PO TABS: 50.0000 mg | ORAL_TABLET | Freq: Every day | ORAL | 0 refills | Status: DC

## 2022-10-06 ENCOUNTER — Other Ambulatory Visit: Payer: Self-pay | Admitting: *Deleted

## 2022-10-06 DIAGNOSIS — I5042 Chronic combined systolic (congestive) and diastolic (congestive) heart failure: Secondary | ICD-10-CM

## 2022-10-26 ENCOUNTER — Other Ambulatory Visit: Payer: Self-pay

## 2022-10-26 DIAGNOSIS — I5042 Chronic combined systolic (congestive) and diastolic (congestive) heart failure: Secondary | ICD-10-CM

## 2022-10-26 MED ORDER — CARVEDILOL 25 MG PO TABS: 25.0000 mg | ORAL_TABLET | Freq: Two times a day (BID) | ORAL | 0 refills | Status: DC

## 2022-10-26 MED ORDER — ISOSORBIDE MONONITRATE ER 30 MG PO TB24: 30.0000 mg | ORAL_TABLET | Freq: Every day | ORAL | 0 refills | Status: DC

## 2022-10-28 MED ORDER — CARVEDILOL 25 MG PO TABS: 25.0000 mg | ORAL_TABLET | Freq: Two times a day (BID) | ORAL | 0 refills | Status: DC

## 2022-10-28 MED ORDER — ISOSORBIDE MONONITRATE ER 30 MG PO TB24
30.0000 mg | ORAL_TABLET | Freq: Every day | ORAL | 0 refills | Status: DC
Start: 2022-10-28 — End: 2022-12-10

## 2022-10-28 NOTE — Addendum Note (Signed)
Addended by: Margaret Pyle D on: 10/28/2022 11:36 AM   Modules accepted: Orders

## 2022-12-10 ENCOUNTER — Encounter: Payer: Self-pay | Admitting: Cardiology

## 2022-12-10 ENCOUNTER — Other Ambulatory Visit: Payer: Self-pay | Admitting: Cardiology

## 2022-12-10 DIAGNOSIS — I5042 Chronic combined systolic (congestive) and diastolic (congestive) heart failure: Secondary | ICD-10-CM

## 2022-12-10 MED ORDER — ISOSORBIDE MONONITRATE ER 30 MG PO TB24
30.0000 mg | ORAL_TABLET | Freq: Every day | ORAL | 0 refills | Status: AC
Start: 2022-12-10 — End: ?

## 2022-12-10 MED ORDER — CARVEDILOL 25 MG PO TABS
25.0000 mg | ORAL_TABLET | Freq: Two times a day (BID) | ORAL | 0 refills | Status: AC
Start: 2022-12-10 — End: ?

## 2022-12-10 MED ORDER — HYDRALAZINE HCL 25 MG PO TABS
75.0000 mg | ORAL_TABLET | Freq: Three times a day (TID) | ORAL | 0 refills | Status: AC
Start: 2022-12-10 — End: ?

## 2022-12-10 MED ORDER — SPIRONOLACTONE 50 MG PO TABS
50.0000 mg | ORAL_TABLET | Freq: Every day | ORAL | 0 refills | Status: AC
Start: 2022-12-10 — End: ?

## 2023-06-01 IMAGING — DX DG CHEST 1V PORT
1 series · 1 of 1 positions shown · non-contrast
Comparison: Chest radiograph 05/06/2021

CLINICAL DATA: Shortness of breath

EXAM:
PORTABLE CHEST 1 VIEW

[chest ap]
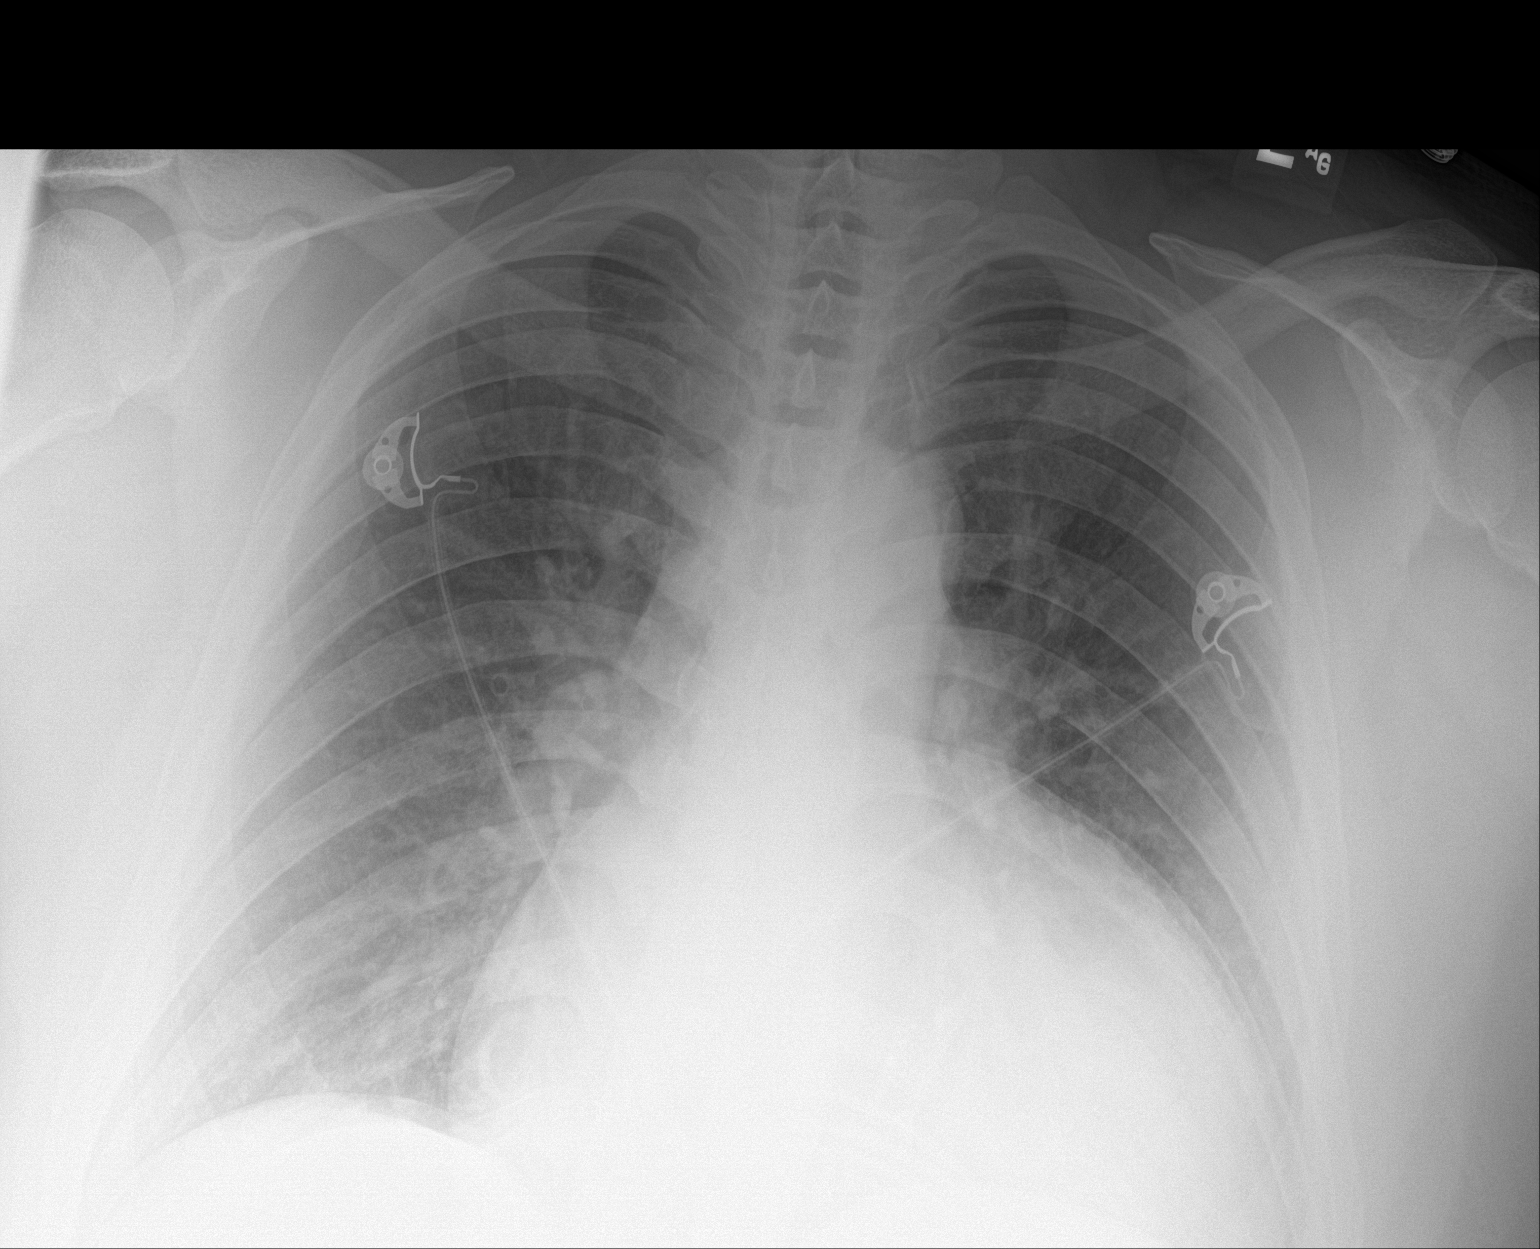

[1 of 1 positions shown; findings below may reference images not displayed]

FINDINGS: The heart is enlarged, unchanged.

The left costophrenic angle is excluded from the field of view.
There is increasing retrocardiac opacity which could reflect
developing infection in the correct clinical setting. The left upper
lung is well aerated. There is no focal consolidation on the right.
There is no right effusion. There is no pneumothorax.

The bones are stable.
IMPRESSION: Increasing retrocardiac opacity suspicious for developing infection.
The left costophrenic angle is cut off, and a left pleural effusion
cannot be excluded. Repeat PA and lateral chest radiographs may be
obtained as indicated.

## 2023-07-20 IMAGING — CT CT HEART MORP W/ CTA COR W/ SCORE W/ CA W/CM &/OR W/O CM
4 of 7 series · 8 of 20 positions shown, 9 images · non-contrast
Comparison: 05/09/2021 chest radiograph

Addendum:
CLINICAL DATA: 30 year old with EF 35%

EXAM:
Cardiac/Coronary  CTA
TECHNIQUE: The patient was scanned on a [employer] scanner.

[Series 6: best diast 72 % · axial · 0.39mm/px · z∈[+1268,+1310]mm · 2 of 323 slices shown]
[im 108/323  vessel]
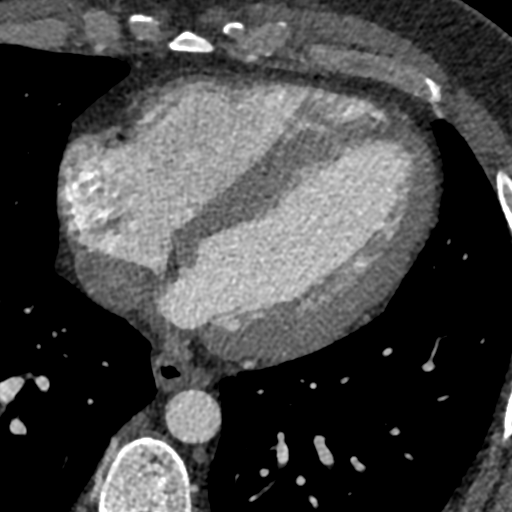
[im 215/323  vessel]
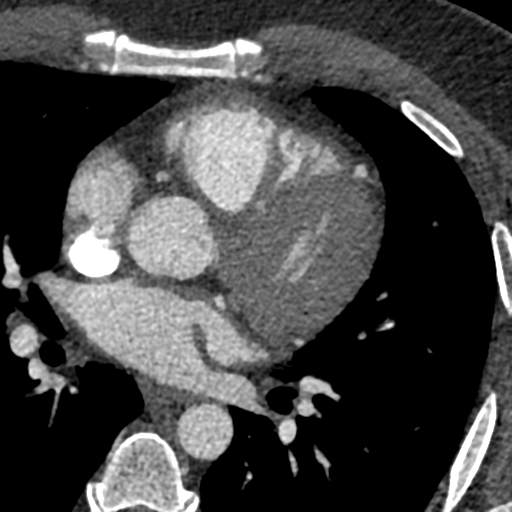

[Series 7: best syst · axial · 0.39mm/px · z∈[+1268,+1310]mm · 2 of 323 slices shown, 3 images]
[im 108/323  vessel]
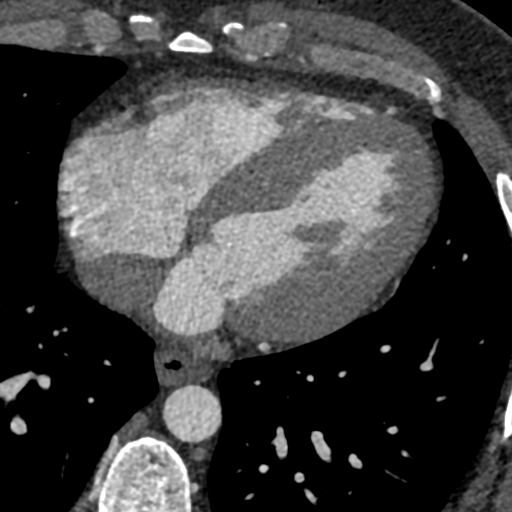
[im 108/323  lung]
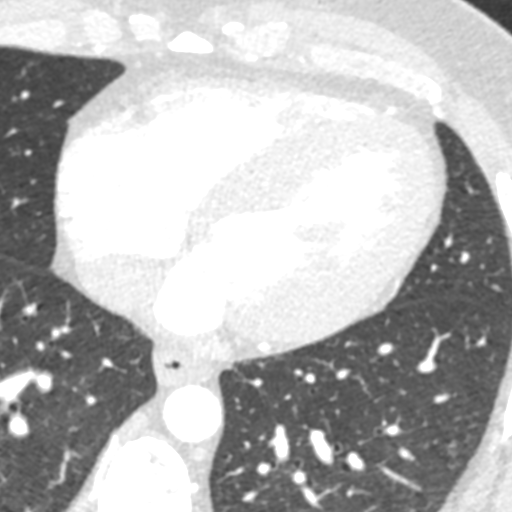
[im 215/323  vessel]
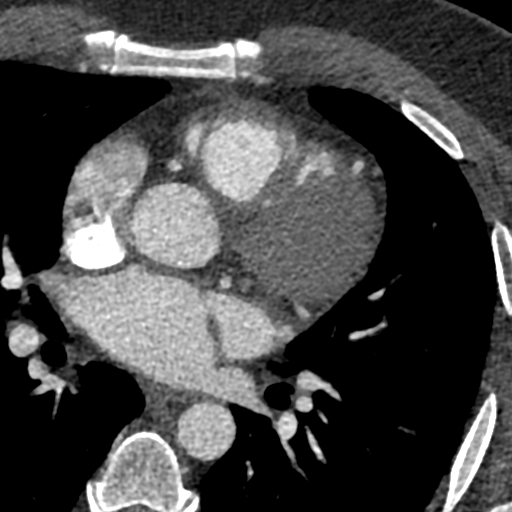

[Series 8: ts diast sharp · axial · 0.39mm/px · z∈[+1268,+1310]mm · 2 of 323 slices shown]
[im 108/323  lung]
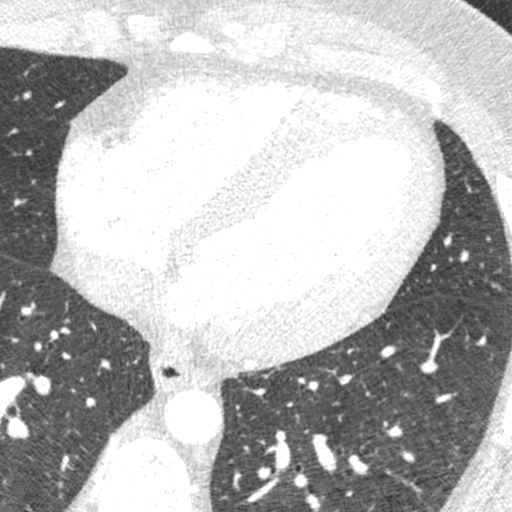
[im 215/323  lung]
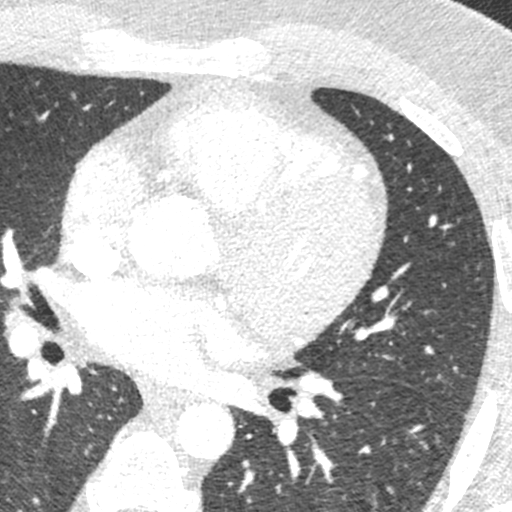

[Series 9: ts syst sharp · axial · 0.39mm/px · z∈[+1268,+1310]mm · 2 of 323 slices shown]
[im 108/323  lung]
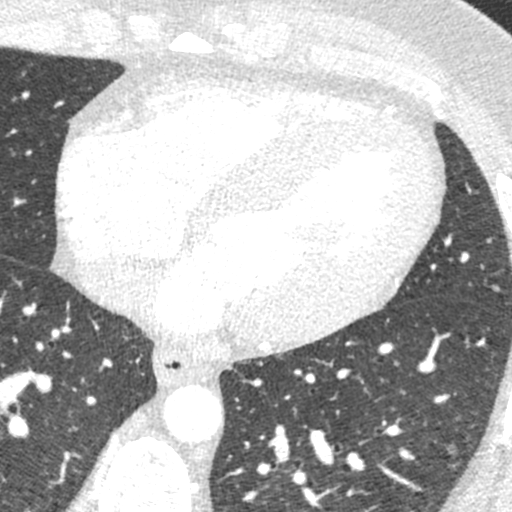
[im 215/323  lung]
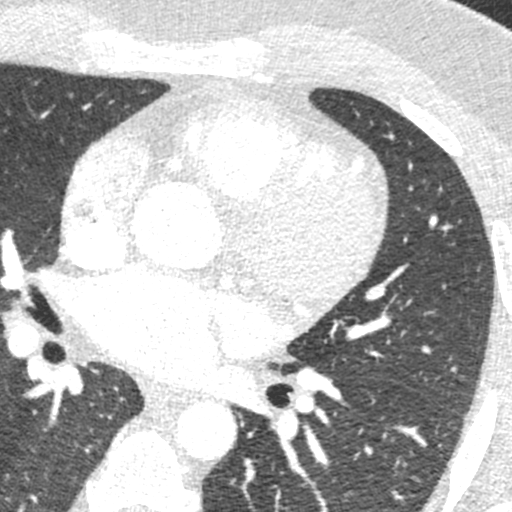

[8 of 20 positions shown; findings below may reference images not displayed]

FINDINGS: A 100 kV prospective scan was triggered in the descending thoracic
aorta at 111 HU's. Axial non-contrast 3 mm slices were carried out
through the heart. The data set was analyzed on a dedicated work
station and scored using the Agatson method. Gantry rotation speed
was 250 msecs and collimation was .6 mm. 0.8 mg of sl NTG was given.
The 3D data set was reconstructed in 5% intervals of the 67-82 % of
the R-R cycle. Diastolic phases were analyzed on a dedicated work
station using MPR, MIP and VRT modes. The patient received 80 cc of
contrast.

Image quality: good

Aorta:  Normal size.  No calcifications.  No dissection.

Aortic Valve:  Trileaflet.  No calcifications.

Coronary Arteries:  Normal coronary origin.  Right dominance.

RCA is a large dominant artery that gives rise to PDA and PLA. There
is no plaque.

Left main is a large artery that gives rise to LAD and LCX arteries.

LAD is a large vessel that has no plaque.

LCX is a non-dominant artery that gives rise to one large OM1
branch. There is no plaque.

Other findings:

Normal pulmonary vein drainage into the left atrium.

Normal left atrial appendage without a thrombus.

Normal size of the pulmonary artery (30 mm borderline).

EF 36%

Please see radiology report for non cardiac findings.
IMPRESSION: 1. Coronary calcium score of 0.

2. Normal coronary origin with right dominance.

3. No evidence of CAD.

4.  Non ischemic cardiomyopathy - EF 36%

EXAM:
OVER-READ INTERPRETATION  CT CHEST

The following report is an over-read performed by radiologist Dr.
Jayne K Melibua [[email or web address]] on 06/30/2021. This over-read
does not include interpretation of cardiac or coronary anatomy or
pathology. The coronary CTA interpretation by the cardiologist is
attached.
FINDINGS: Vascular: Normal aortic caliber. No central pulmonary embolism, on
this non-dedicated study.

Mediastinum/Nodes: No imaged thoracic adenopathy.

Lungs/Pleura: No pleural fluid.  Clear imaged lungs.

Upper Abdomen: Normal imaged portions of the liver, spleen, stomach.

Musculoskeletal: No acute osseous abnormality.
IMPRESSION: No acute findings in the imaged extracardiac chest.

*** End of Addendum ***
FINDINGS: A 100 kV prospective scan was triggered in the descending thoracic
aorta at 111 HU's. Axial non-contrast 3 mm slices were carried out
through the heart. The data set was analyzed on a dedicated work
station and scored using the Agatson method. Gantry rotation speed
was 250 msecs and collimation was .6 mm. 0.8 mg of sl NTG was given.
The 3D data set was reconstructed in 5% intervals of the 67-82 % of
the R-R cycle. Diastolic phases were analyzed on a dedicated work
station using MPR, MIP and VRT modes. The patient received 80 cc of
contrast.

Image quality: good

Aorta:  Normal size.  No calcifications.  No dissection.

Aortic Valve:  Trileaflet.  No calcifications.

Coronary Arteries:  Normal coronary origin.  Right dominance.

RCA is a large dominant artery that gives rise to PDA and PLA. There
is no plaque.

Left main is a large artery that gives rise to LAD and LCX arteries.

LAD is a large vessel that has no plaque.

LCX is a non-dominant artery that gives rise to one large OM1
branch. There is no plaque.

Other findings:

Normal pulmonary vein drainage into the left atrium.

Normal left atrial appendage without a thrombus.

Normal size of the pulmonary artery (30 mm borderline).

EF 36%

Please see radiology report for non cardiac findings.
IMPRESSION: 1. Coronary calcium score of 0.

2. Normal coronary origin with right dominance.

3. No evidence of CAD.

4.  Non ischemic cardiomyopathy - EF 36%

## 2023-08-31 DIAGNOSIS — F411 Generalized anxiety disorder: Secondary | ICD-10-CM | POA: Diagnosis not present
